# Patient Record
Sex: Female | Born: 1995 | Race: White | Hispanic: No | Marital: Married | Smoking: Never smoker
Health system: Southern US, Community
[De-identification: ages and names within clinical notes are randomized; demographics above are authoritative.]

## PROBLEM LIST (undated history)

## (undated) DIAGNOSIS — J45909 Unspecified asthma, uncomplicated: Secondary | ICD-10-CM

## (undated) DIAGNOSIS — G43909 Migraine, unspecified, not intractable, without status migrainosus: Secondary | ICD-10-CM

## (undated) DIAGNOSIS — F419 Anxiety disorder, unspecified: Secondary | ICD-10-CM

## (undated) HISTORY — PX: WISDOM TOOTH EXTRACTION: SHX21

---
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
## 2013-01-26 ENCOUNTER — Other Ambulatory Visit: Payer: Self-pay

## 2013-01-26 DIAGNOSIS — G43009 Migraine without aura, not intractable, without status migrainosus: Secondary | ICD-10-CM

## 2013-01-26 DIAGNOSIS — G4452 New daily persistent headache (NDPH): Secondary | ICD-10-CM

## 2013-01-26 MED ORDER — NADOLOL 20 MG PO TABS: 20.0000 mg | ORAL_TABLET | Freq: Every day | ORAL | Status: DC

## 2013-01-26 NOTE — Telephone Encounter (Signed)
Faxed to [phone]

## 2013-05-17 ENCOUNTER — Ambulatory Visit (INDEPENDENT_AMBULATORY_CARE_PROVIDER_SITE_OTHER): Payer: BC Managed Care – PPO | Admitting: Family

## 2013-05-17 ENCOUNTER — Encounter: Payer: Self-pay | Admitting: Family

## 2013-05-17 VITALS — BP 110/74 | HR 78 | Ht 60.25 in | Wt 186.8 lb

## 2013-05-17 DIAGNOSIS — G44219 Episodic tension-type headache, not intractable: Secondary | ICD-10-CM

## 2013-05-17 DIAGNOSIS — G43009 Migraine without aura, not intractable, without status migrainosus: Secondary | ICD-10-CM

## 2013-05-17 DIAGNOSIS — Z8669 Personal history of other diseases of the nervous system and sense organs: Secondary | ICD-10-CM

## 2013-05-17 NOTE — Progress Notes (Signed)
Patient: Bonnie Cole MRN: [id number] Sex: female DOB: [birth year]  Provider: Elveria Rising, NP Location of Care: [facility]  Note type: Routine return visit  History of Present Illness: Referral Source: Dr.Candice Smith History from: patient Chief Complaint: Headaches  Bonnie Cole is a 18 y.o. with history of headaches. She used to take Topamax for migraine prevention but it did not reduce her headache frequency or severity. She is now taking Nadolol, which resulted in improvement in her headaches. She reports today that her headaches and migraines are infrequent as long as she gets sufficient sleep. When she has a migraine, Sumatriptan works well to give her relief. Bonnie Cole had an allergic reaction to a food, possibly peanuts a couple of weeks ago and has been taking a steroid for that. She has some increase in headaches since taking this medication. She has an upcoming appointment with her PCP to further evaluate this allergy.   Review of Systems: 12 system review was remarkable for fatigue, chest pain, rash, short of breath, flushing, allergies, headache, dizziness, anxiety, insomnia and sleepiness  Past Medical History  Diagnosis Date  . Asthma   . Migraine   . Anxiety    Hospitalizations: no, Head Injury: no, Nervous System Infections: no, Immunizations up to date: yes Past Medical History Comments: [name] has history of syncope and dysautonomia. She was taking Imipramine but says that she has tapered off it successfully.  Surgical History Past Surgical History  Procedure Laterality Date  . Wisdom tooth extraction      Family History family history includes Dementia in her paternal grandmother; Heart attack in her maternal grandfather. Family History is otherwise negative for migraines, seizures, cognitive impairment, blindness, deafness, birth defects, chromosomal disorder, autism.  Social History History   Social History  . Marital Status:  Married    Spouse Name: N/A    Number of Children: N/A  . Years of Education: N/A   Social History Main Topics  . Smoking status: Never Smoker   . Smokeless tobacco: Never Used  . Alcohol Use: No  . Drug Use: No  . Sexual Activity: Yes   Other Topics Concern  . None   Social History Narrative  . None   Educational level: university  School Attending:[employer] Living with:  roommates  Hobbies/Interest: Homework School comments:  [name] is doing well in school. She is in her Sophomore year at [employer]. Her major is Psychology. She graduated from [employer] in 2013.  Physical Exam BP 110/74  Pulse 78  Ht 5' 0.25" (1.53 m)  Wt 186 lb 12.8 oz (84.732 kg)  BMI 36.20 kg/m2  LMP 04/11/2013 General: alert, well developed, well nourished young woman, in no acute distress, right-handed, blond hair, brown eyes Head: normocephalic, no dysmorphic features Ears, Nose and Throat: Otoscopic: tympanic membranes normal .  Pharynx: oropharynx is pink without exudates or tonsillar hypertrophy. Neck: supple, full range of motion, no cranial or cervical bruits Respiratory: auscultation clear Cardiovascular: no murmurs, pulses are normal Musculoskeletal: no skeletal deformities or apparent scoliosis Skin: no rashes or neurocutaneous lesions  Neurologic Exam  Mental Status: alert; oriented to person, place, and year; knowledge is normal for age; language is normal Cranial Nerves: visual fields are full to double simultaneous stimuli; extraocular movements are full and conjugate; pupils are round reactive to light; funduscopic examination shows sharp disc margins with normal vessels; symmetric facial strength; midline tongue and uvula; hearing normal and symmetric Motor: Normal strength,  tone, and mass; good fine motor movements; no pronator drift. Sensory: intact responses to touch and temperature Coordination: good finger-to-nose, rapid repetitive  alternating movements and finger apposition   Gait and Station: normal gait and station; patient is able to walk on heels, toes and tandem without difficulty; balance is adequate; Romberg exam is negative; Gower response is negative Reflexes: symmetric and diminished bilaterally; no clonus; bilateral flexor plantar responses.  Assessment and Plan Bonnie Cole is a 18 year old young woman with history of headaches and migraines. She is taking and tolerating Nadolol for headache prevention. She will continue her medication without change and will return in 1 year or sooner if needed.

## 2013-05-19 ENCOUNTER — Encounter: Payer: Self-pay | Admitting: Family

## 2013-05-19 DIAGNOSIS — G44219 Episodic tension-type headache, not intractable: Secondary | ICD-10-CM | POA: Insufficient documentation

## 2013-05-19 DIAGNOSIS — G43009 Migraine without aura, not intractable, without status migrainosus: Secondary | ICD-10-CM | POA: Insufficient documentation

## 2013-05-19 NOTE — Patient Instructions (Signed)
Continue your medication without change. Let me know if your headaches increase in frequency or severity.  Please return for follow up in 1 year or sooner if needed.

## 2013-09-12 IMAGING — CR DG CHEST 2V
2 series · 2 of 2 positions shown · non-contrast
Comparison: August 08, 2004

CLINICAL DATA: Chest pain

CHEST - 2 VIEW

[w chest pa]
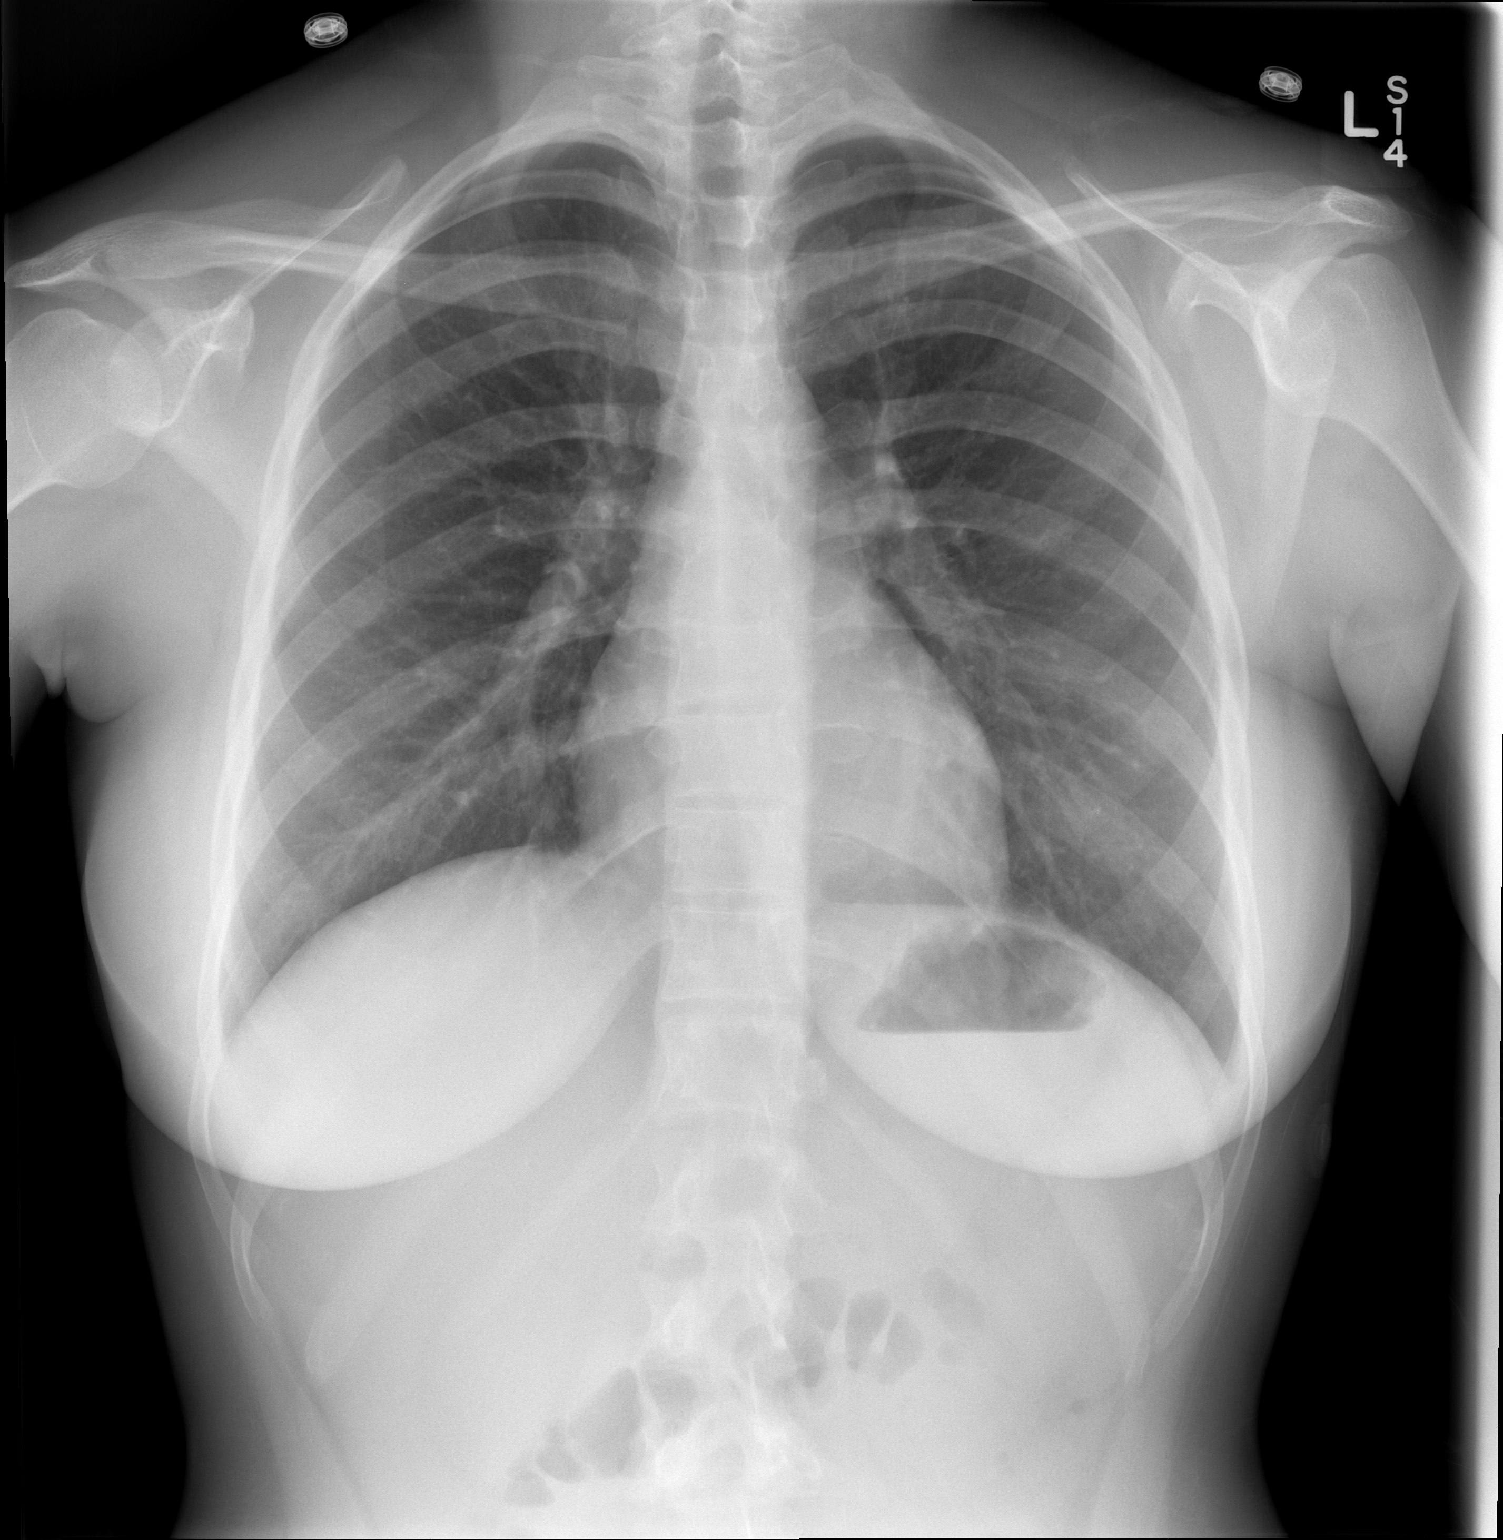

[w chest lat]
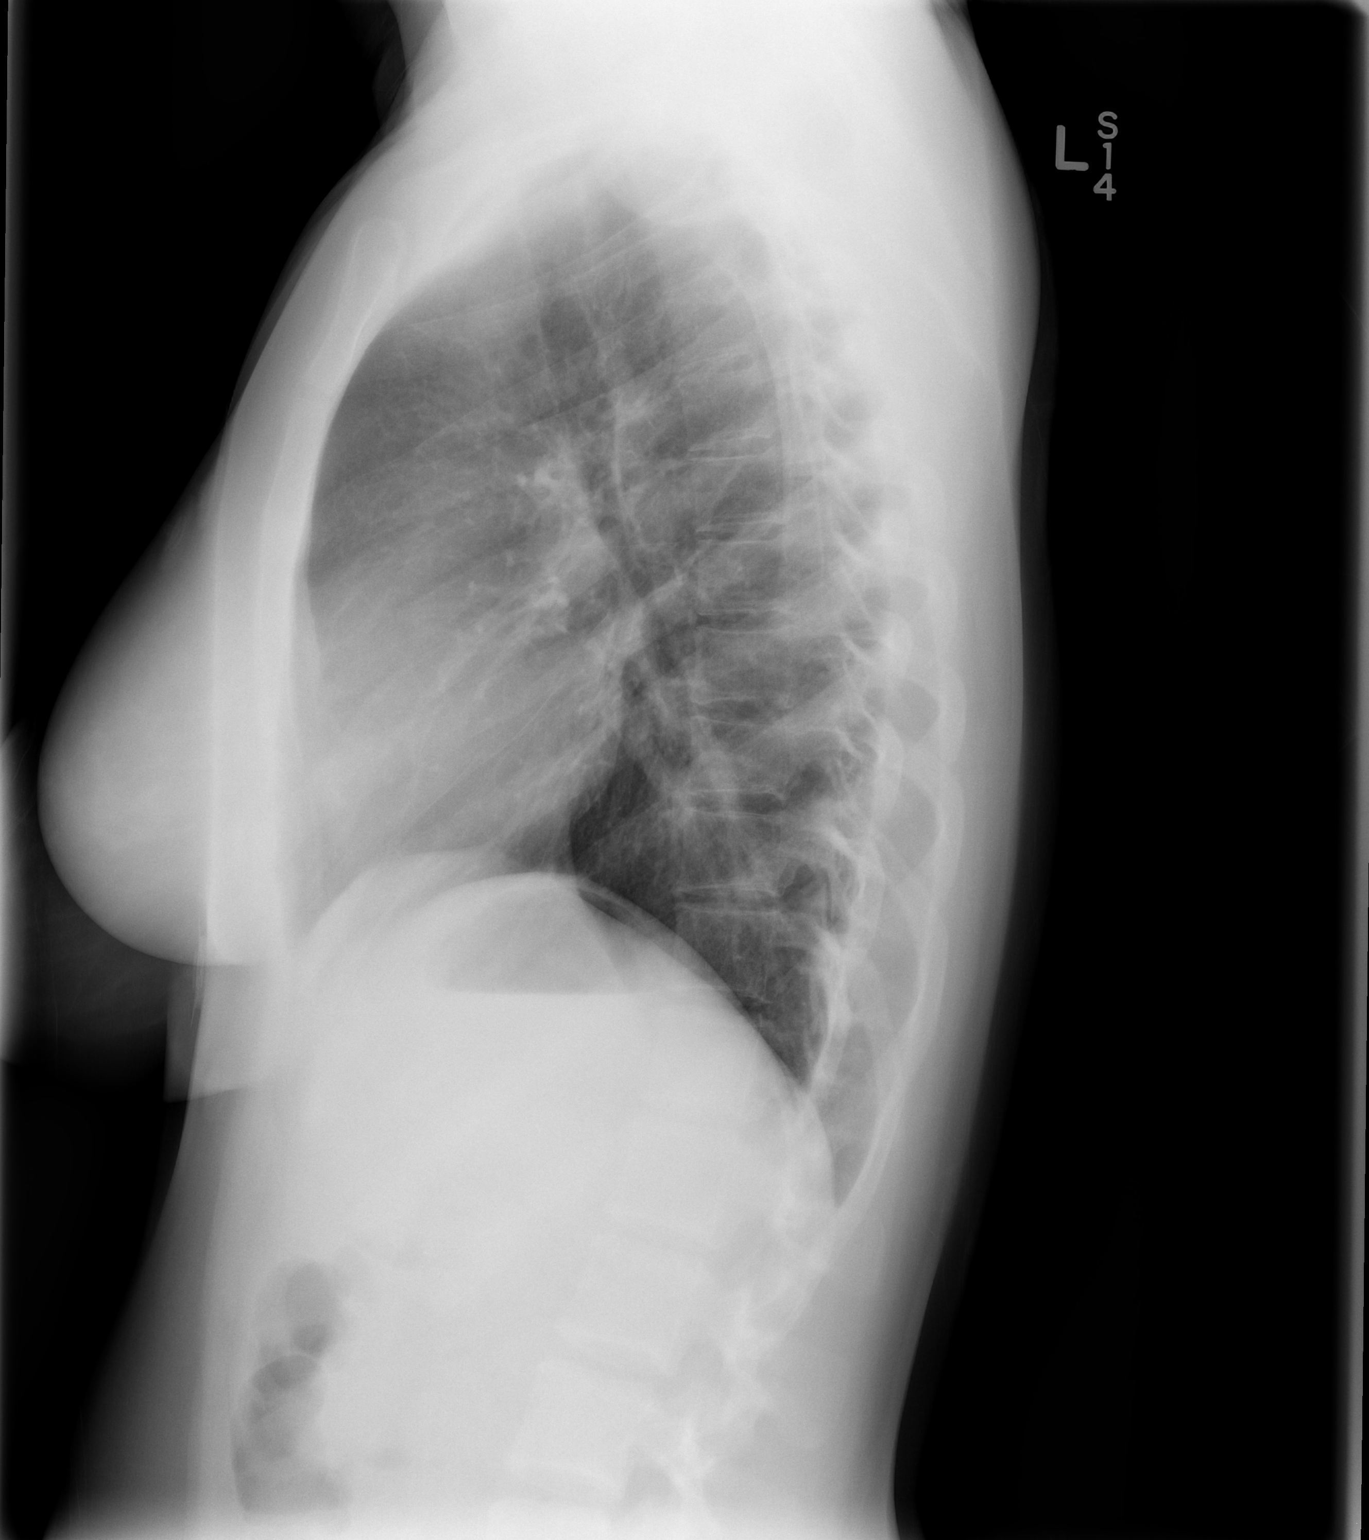

[2 of 2 positions shown; findings below may reference images not displayed]

FINDINGS: Lungs clear.  Heart size and pulmonary vascularity are
normal.  No adenopathy.  No bone lesions.  No pneumothorax.
IMPRESSION: No abnormality noted.

## 2014-02-08 ENCOUNTER — Other Ambulatory Visit: Payer: Self-pay

## 2014-02-08 MED ORDER — NADOLOL 20 MG PO TABS: 20.0000 mg | ORAL_TABLET | Freq: Every day | ORAL | Status: DC

## 2014-02-09 ENCOUNTER — Other Ambulatory Visit: Payer: Self-pay | Admitting: Family

## 2014-02-09 DIAGNOSIS — G43009 Migraine without aura, not intractable, without status migrainosus: Secondary | ICD-10-CM

## 2014-02-09 MED ORDER — TIZANIDINE HCL 2 MG PO TABS: ORAL_TABLET | ORAL | Status: DC

## 2014-05-10 ENCOUNTER — Encounter: Payer: Self-pay | Admitting: Family

## 2014-10-21 ENCOUNTER — Telehealth: Payer: Self-pay | Admitting: *Deleted

## 2014-10-21 NOTE — Telephone Encounter (Signed)
Spoke with Bonnie Cole and she agreed to come in for another appointment. We scheduled her for Tuesday August 16th, at 3:00pm.

## 2014-10-21 NOTE — Telephone Encounter (Signed)
Please let Bonnie Cole know that since it has been over a year since her last visit here, that she needs to return here for one more visit, then I will be happy to refer to her adult neurology. Her other option is for her PCP to refer her to adult neurology, if she has seen her PCP more recently. If she wants to schedule her, please schedule her with me. Thanks, Inetta Fermo

## 2014-10-21 NOTE — Telephone Encounter (Signed)
Bonnie Cole called and left a voicemail requesting a referral to adult neurology. She states she is no longer and in school and is aware that we cannot see her in this practice after she has finished school.

## 2014-10-25 ENCOUNTER — Ambulatory Visit: Payer: Self-pay | Admitting: Family

## 2014-11-17 ENCOUNTER — Ambulatory Visit (INDEPENDENT_AMBULATORY_CARE_PROVIDER_SITE_OTHER): Payer: 59 | Admitting: Pediatrics

## 2014-11-17 ENCOUNTER — Encounter: Payer: Self-pay | Admitting: Pediatrics

## 2014-11-17 ENCOUNTER — Ambulatory Visit: Payer: Self-pay | Admitting: Family

## 2014-11-17 VITALS — BP 118/78 | HR 76 | Ht 65.0 in | Wt 207.0 lb

## 2014-11-17 DIAGNOSIS — G43109 Migraine with aura, not intractable, without status migrainosus: Secondary | ICD-10-CM

## 2014-11-17 DIAGNOSIS — G44219 Episodic tension-type headache, not intractable: Secondary | ICD-10-CM | POA: Diagnosis not present

## 2014-11-17 DIAGNOSIS — G43909 Migraine, unspecified, not intractable, without status migrainosus: Secondary | ICD-10-CM

## 2014-11-17 DIAGNOSIS — E669 Obesity, unspecified: Secondary | ICD-10-CM

## 2014-11-17 MED ORDER — NADOLOL 40 MG PO TABS: ORAL_TABLET | ORAL | Status: DC

## 2014-11-17 MED ORDER — SUMATRIPTAN SUCCINATE 50 MG PO TABS: ORAL_TABLET | ORAL | Status: DC

## 2014-11-17 NOTE — Progress Notes (Signed)
Patient: Bonnie Cole MRN: [id number] Sex: female DOB: 1995-03-24  Provider: Deetta Perla, MD Location of Care: [facility]  Note type: Routine return visit  History of Present Illness: Referral Source: Severiano Gilbert, MD History from: patient and [facility] chart Chief Complaint: Headaches/Referral to adult neurologist  Bonnie Cole is a 19 y.o. female who was evaluated November 17, 2014 for the first time since May 17, 2013.  She has a history of migraine without aura and episodic tension-type headaches.  Topiramate did not reduce her headache frequency and also made her tired and irritable.  She was switched to nadolol, which resulted in improvement in her headaches.  Her rescue drugs included Excedrin Migraine, low-dose sumatriptan, and tizanidine.  The latter always worked.  The other two sometimes work depending on how soon after her headache she was able to take medication.  She is finishing up a psychiatric major at [employer] and will take a post-bac year to work on other courses.  She thinks that she wants to become a Surveyor, mining or a physician.  She needs to take prerequisites for these programs.  She tells me that her headaches have fluctuated and tend to be worse when she is at school although over the summer, her headaches worsened.  She estimates she has two to three incapacitating headaches per week.  In addition, she has a "mind fog" when she has her headaches.  She also experiences blurred vision.  This starts as a speckled aura in the left inferior visual field involving both eyes and to become more diffuse during the headache and concurrently with the headache.  She has occasional nausea, but no vomiting.  She complains of sensitivity to light, sound, and movement.  Headaches can last one to two hours if she takes medicine and it works for eight hours if it does not.  Sumatriptan at 25 mg has not always worked.  Tizanidine at 4 mg almost always does,  but she is unable to do anything else except sleep.  Her headaches affect her homework more than school.  She will not miss school because of a migraine.    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  She has struggled to find work and life balance.  She has gained 21 pounds since she was last seen.  She tried a vegetarian diet for a while, but did not help maintain her weight nor did it eliminate her headaches.  She only sleeps about six hours at nighttime, but admits that she needs seven or eight hours to function well.  She thinks that she may have been on amitriptyline in addition to topiramate in the past.  Review of Systems: 12 system review was unremarkable  Past Medical History Diagnosis Date  . Asthma   . Migraine   . Anxiety    Hospitalizations: No., Head Injury: No., Nervous System Infections: No., Immunizations up to date: Yes.    Behavior History none  Surgical History Procedure Laterality Date  . Wisdom tooth extraction     Family History family history includes Dementia in her paternal grandmother; Heart attack in her maternal grandfather. Family history is negative for migraines, seizures, intellectual disabilities, blindness, deafness, birth defects, chromosomal disorder, or autism.  Social History . Marital Status: Married    Spouse Name: N/A  . Number of Children: N/A  . Years of Education: N/A   Social History Main Topics  . Smoking status: Never Smoker   .  Smokeless tobacco: Never Used  . Alcohol Use: No  . Drug Use: No  . 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    [name] lives with her boyfriend.    [name] enjoys school, work, going out with friends, and walking her dog.    [name] is doing well in school.    Allergies Allergen Reactions  . Tetracyclines & Related Hives   Physical Exam BP 118/78 mmHg  Pulse 76  Ht 5\' 5"  (1.651  m)  Wt 207 lb (93.895 kg)  BMI 34.45 kg/m2  LMP 10/17/2014  General: alert, well developed, well nourished, in no acute distress, blond hair, brown eyes, right handed Head: normocephalic, no dysmorphic features Ears, Nose and Throat: Otoscopic: tympanic membranes normal; pharynx: oropharynx is pink without exudates or tonsillar hypertrophy Neck: supple, full range of motion, no cranial or cervical bruits Respiratory: auscultation clear Cardiovascular: no murmurs, pulses are normal Musculoskeletal: no skeletal deformities or apparent scoliosis Skin: no rashes or neurocutaneous lesions  Neurologic Exam  Mental Status: alert; oriented to person, place and year; knowledge is normal for age; language is normal Cranial Nerves: visual fields are full to double simultaneous stimuli; extraocular movements are full and conjugate; pupils are round reactive to light; funduscopic examination shows sharp disc margins with normal vessels; symmetric facial strength; midline tongue and uvula; air conduction is greater than bone conduction bilaterally Motor: Normal strength, tone and mass; good fine motor movements; no pronator drift Sensory: intact responses to cold, vibration, proprioception and stereognosis Coordination: good finger-to-nose, rapid repetitive alternating movements and finger apposition Gait and Station: normal gait and station: patient is able to walk on heels, toes and tandem without difficulty; balance is adequate; Romberg exam is negative; Gower response is negative Reflexes: symmetric and diminished bilaterally; no clonus; bilateral flexor plantar responses  Assessment 1. Migraine with aura and without status migrainosus, not intractable, G43.109. 2. Complicated migraine, G43.909. 3. Episodic tension-type headache not intractable, G44.219. 4. Obesity, E66.9.  Discussion [name] has a primary headache disorder.  This is based on normal examination, characteristic symptoms, and the  duration of those symptoms.  There is no family history of migraines.  In my opinion, she does not need neuroimaging.  Plan She will keep a daily prospective headache calendar.  She will work to try to increase the number of hours that she sleeps at night.  I recommended that she drink 2-1/2, 16-ounce containers of water per day and eat small frequent meals.  As I receive headache calendars, I will contact her.  She will return to see me in three months' time.  I spent 30 minutes of face-to-face time with Reis more than half of it in consultation.  I told her that we will continue to see her as long as she remains a student if that is her wish.   Medication List   This list is accurate as of: 11/17/14  6:42 PM.       albuterol 108 (90 BASE) MCG/ACT inhaler  Commonly known as:  PROVENTIL HFA;VENTOLIN HFA  Inhale 2 puffs into the lungs every 6 (six) hours as needed for wheezing.     nadolol 40 MG tablet  Commonly known as:  CORGARD  Take 1 tablet at bedtime     OCELLA 3-0.03 MG tablet  Generic drug:  drospirenone-ethinyl estradiol  Take 1 tablet by mouth at bedtime.     sertraline 50 MG  tablet  Commonly known as:  ZOLOFT  Take 1 tablet by mouth daily.     SUMAtriptan 50 MG tablet  Commonly known as:  IMITREX  Take 1 tablet with 400 mg of ibuprofen at the onset of headache, may repeat in 2 hours if headache persists or recurs.     tiZANidine 4 MG tablet  Commonly known as:  ZANAFLEX  Take 4 mg by mouth at bedtime as needed (for migraine).      The medication list was reviewed and reconciled. All changes or newly prescribed medications were explained.  A complete medication list was provided to the patient/caregiver.  Deetta Perla MD

## 2014-11-17 NOTE — Patient Instructions (Signed)
You need to plan your day so that you can get more than 6 hours of sleep.  The idea would be close to 8.  Need to drink 2-1/2-3 16 ounce water bottles per day throughout the day.  You should not skip meals, because that put stress on your body due to the need to fast.  Keep you headache calendar and send it to me at the end of each month.

## 2015-01-02 ENCOUNTER — Telehealth: Payer: Self-pay | Admitting: Pediatrics

## 2015-01-02 DIAGNOSIS — G43109 Migraine with aura, not intractable, without status migrainosus: Secondary | ICD-10-CM

## 2015-01-02 NOTE — Telephone Encounter (Addendum)
Headache calendar from September 2016 on Bonnie Connersmanda J Confer. 23 days were recorded.  2 days were headache free.  13 days were associated with tension type headaches, 10 required treatment.  There were 8 days of migraines, 2 were severe.  I called the patient and left a message for her to call back.

## 2015-01-10 MED ORDER — NADOLOL 40 MG PO TABS: ORAL_TABLET | ORAL | Status: DC

## 2015-01-10 NOTE — Telephone Encounter (Signed)
Bonnie Cole called returning Dr. Darl HouseholderHickling's call in regards to her headache calendar. She can be reached at [phone].

## 2015-01-10 NOTE — Telephone Encounter (Signed)
15 minutes phone call the patient.  Her headaches occur when she gets only 5 or 6 hours of sleep, and also during her menstrual period she had a total of 10 migraines last month.  Many of them can be attributed to one or the other issue.  On the 2 weeks when she was not with period and got adequate sleep, she had only one migraine each week.  Corgard is scored; we will increase her from 40-60 mg a day and see how she tolerates the medicine.  I will send a 90 day supply.

## 2015-05-25 ENCOUNTER — Encounter: Payer: Self-pay | Admitting: Family

## 2015-06-01 ENCOUNTER — Other Ambulatory Visit (HOSPITAL_COMMUNITY)
Admission: RE | Admit: 2015-06-01 | Discharge: 2015-06-01 | Disposition: A | Discharge status: Home / Self Care | Payer: 59 | Source: Ambulatory Visit | Attending: Family Medicine | Admitting: Family Medicine

## 2015-06-01 DIAGNOSIS — Z01419 Encounter for gynecological examination (general) (routine) without abnormal findings: Secondary | ICD-10-CM | POA: Insufficient documentation

## 2015-06-01 DIAGNOSIS — Z113 Encounter for screening for infections with a predominantly sexual mode of transmission: Secondary | ICD-10-CM | POA: Insufficient documentation

## 2016-12-02 ENCOUNTER — Encounter (HOSPITAL_BASED_OUTPATIENT_CLINIC_OR_DEPARTMENT_OTHER): Payer: Self-pay | Admitting: *Deleted

## 2016-12-03 ENCOUNTER — Encounter (HOSPITAL_BASED_OUTPATIENT_CLINIC_OR_DEPARTMENT_OTHER): Payer: Self-pay | Admitting: *Deleted

## 2016-12-03 NOTE — Progress Notes (Signed)
Bring all medications. Pack an overnight bag. 

## 2016-12-11 ENCOUNTER — Ambulatory Visit (HOSPITAL_BASED_OUTPATIENT_CLINIC_OR_DEPARTMENT_OTHER): Payer: 59 | Admitting: Anesthesiology

## 2016-12-11 ENCOUNTER — Ambulatory Visit (HOSPITAL_BASED_OUTPATIENT_CLINIC_OR_DEPARTMENT_OTHER)
Admission: RE | Admit: 2016-12-11 | Discharge: 2016-12-11 | Disposition: A | Discharge status: Home / Self Care | Payer: 59 | Source: Ambulatory Visit | Attending: Otolaryngology | Admitting: Otolaryngology

## 2016-12-11 ENCOUNTER — Encounter (HOSPITAL_BASED_OUTPATIENT_CLINIC_OR_DEPARTMENT_OTHER)
Admission: RE | Disposition: A | Discharge status: Home / Self Care | Payer: Self-pay | Source: Ambulatory Visit | Attending: Otolaryngology

## 2016-12-11 ENCOUNTER — Encounter (HOSPITAL_BASED_OUTPATIENT_CLINIC_OR_DEPARTMENT_OTHER): Payer: Self-pay | Admitting: Anesthesiology

## 2016-12-11 DIAGNOSIS — Z881 Allergy status to other antibiotic agents status: Secondary | ICD-10-CM | POA: Diagnosis not present

## 2016-12-11 DIAGNOSIS — J358 Other chronic diseases of tonsils and adenoids: Secondary | ICD-10-CM | POA: Insufficient documentation

## 2016-12-11 DIAGNOSIS — R196 Halitosis: Secondary | ICD-10-CM | POA: Insufficient documentation

## 2016-12-11 HISTORY — PX: TONSILLECTOMY: SHX5217

## 2016-12-11 SURGERY — TONSILLECTOMY
Anesthesia: General | Site: Throat | Laterality: Bilateral

## 2016-12-11 MED ORDER — SCOPOLAMINE 1 MG/3DAYS TD PT72
1.0000 | MEDICATED_PATCH | Freq: Once | TRANSDERMAL | Status: DC | PRN
  Administered 2016-12-11: 1.5 mg via TRANSDERMAL

## 2016-12-11 MED ORDER — HYDROMORPHONE HCL 1 MG/ML IJ SOLN
0.2500 mg | INTRAMUSCULAR | Status: DC | PRN
  Administered 2016-12-11 (×2): 0.5 mg via INTRAVENOUS
  Administered 2016-12-11: 0.25 mg via INTRAVENOUS

## 2016-12-11 MED ORDER — PROPOFOL 500 MG/50ML IV EMUL
INTRAVENOUS | Status: AC
  Filled 2016-12-11: qty 50

## 2016-12-11 MED ORDER — OXYCODONE HCL 5 MG PO TABS: 5.0000 mg | ORAL_TABLET | ORAL | 0 refills | Status: DC | PRN

## 2016-12-11 MED ORDER — HYDROMORPHONE HCL 1 MG/ML IJ SOLN
INTRAMUSCULAR | Status: AC
  Filled 2016-12-11: qty 0.5

## 2016-12-11 MED ORDER — MIDAZOLAM HCL 2 MG/2ML IJ SOLN
1.0000 mg | INTRAMUSCULAR | Status: DC | PRN
  Administered 2016-12-11: 2 mg via INTRAVENOUS

## 2016-12-11 MED ORDER — SCOPOLAMINE 1 MG/3DAYS TD PT72
MEDICATED_PATCH | TRANSDERMAL | Status: AC
  Filled 2016-12-11: qty 1

## 2016-12-11 MED ORDER — PROMETHAZINE HCL 25 MG/ML IJ SOLN
INTRAMUSCULAR | Status: AC
  Filled 2016-12-11: qty 1

## 2016-12-11 MED ORDER — MIDAZOLAM HCL 2 MG/2ML IJ SOLN
INTRAMUSCULAR | Status: AC
  Filled 2016-12-11: qty 2

## 2016-12-11 MED ORDER — PROPOFOL 10 MG/ML IV BOLUS
INTRAVENOUS | Status: DC | PRN
Start: 2016-12-11 — End: 2016-12-11
  Administered 2016-12-11: 180 mg via INTRAVENOUS

## 2016-12-11 MED ORDER — OXYCODONE HCL 5 MG/5ML PO SOLN
5.0000 mg | Freq: Once | ORAL | Status: AC | PRN
  Administered 2016-12-11: 5 mg via ORAL

## 2016-12-11 MED ORDER — PROMETHAZINE HCL 25 MG/ML IJ SOLN
6.2500 mg | Freq: Four times a day (QID) | INTRAMUSCULAR | Status: AC | PRN
  Administered 2016-12-11: 6.25 mg via INTRAVENOUS

## 2016-12-11 MED ORDER — ONDANSETRON HCL 4 MG/2ML IJ SOLN
INTRAMUSCULAR | Status: AC
  Filled 2016-12-11: qty 2

## 2016-12-11 MED ORDER — DEXAMETHASONE SODIUM PHOSPHATE 10 MG/ML IJ SOLN
INTRAMUSCULAR | Status: AC
  Filled 2016-12-11: qty 1

## 2016-12-11 MED ORDER — SUCCINYLCHOLINE CHLORIDE 20 MG/ML IJ SOLN
INTRAMUSCULAR | Status: DC | PRN
  Administered 2016-12-11: 100 mg via INTRAVENOUS

## 2016-12-11 MED ORDER — 0.9 % SODIUM CHLORIDE (POUR BTL) OPTIME
TOPICAL | Status: DC | PRN
  Administered 2016-12-11: 120 mL

## 2016-12-11 MED ORDER — FENTANYL CITRATE (PF) 100 MCG/2ML IJ SOLN
INTRAMUSCULAR | Status: AC
  Filled 2016-12-11: qty 4

## 2016-12-11 MED ORDER — OXYCODONE HCL 5 MG/5ML PO SOLN
ORAL | Status: AC
  Filled 2016-12-11: qty 5

## 2016-12-11 MED ORDER — PROPOFOL 500 MG/50ML IV EMUL
INTRAVENOUS | Status: DC | PRN
  Administered 2016-12-11: 50 ug/kg/min via INTRAVENOUS

## 2016-12-11 MED ORDER — LIDOCAINE HCL (CARDIAC) 20 MG/ML IV SOLN
INTRAVENOUS | Status: DC | PRN
  Administered 2016-12-11: 60 mg via INTRAVENOUS

## 2016-12-11 MED ORDER — FENTANYL CITRATE (PF) 100 MCG/2ML IJ SOLN
50.0000 ug | INTRAMUSCULAR | Status: DC | PRN
  Administered 2016-12-11 (×2): 100 ug via INTRAVENOUS

## 2016-12-11 MED ORDER — MEPERIDINE HCL 25 MG/ML IJ SOLN: 6.2500 mg | INTRAMUSCULAR | Status: DC | PRN

## 2016-12-11 MED ORDER — CIPROFLOXACIN IN D5W 400 MG/200ML IV SOLN
INTRAVENOUS | Status: AC
  Filled 2016-12-11: qty 200

## 2016-12-11 MED ORDER — LACTATED RINGERS IV SOLN
INTRAVENOUS | Status: DC
  Administered 2016-12-11 (×2): via INTRAVENOUS

## 2016-12-11 MED ORDER — DEXAMETHASONE SODIUM PHOSPHATE 4 MG/ML IJ SOLN
INTRAMUSCULAR | Status: DC | PRN
  Administered 2016-12-11: 10 mg via INTRAVENOUS

## 2016-12-11 MED ORDER — METOCLOPRAMIDE HCL 5 MG/ML IJ SOLN: 10.0000 mg | Freq: Once | INTRAMUSCULAR | Status: DC | PRN

## 2016-12-11 MED ORDER — OXYCODONE HCL 5 MG PO TABS: 5.0000 mg | ORAL_TABLET | Freq: Once | ORAL | Status: AC | PRN

## 2016-12-11 MED ORDER — OXYMETAZOLINE HCL 0.05 % NA SOLN
NASAL | Status: AC
  Filled 2016-12-11: qty 15

## 2016-12-11 SURGICAL SUPPLY — 30 items
CANISTER SUCT 1200ML W/VALVE (MISCELLANEOUS) ×3 IMPLANT
CATH ROBINSON RED A/P 10FR (CATHETERS) IMPLANT
CLEANER CAUTERY TIP 5X5 PAD (MISCELLANEOUS) IMPLANT
COAGULATOR SUCT SWTCH 10FR 6 (ELECTROSURGICAL) ×3 IMPLANT
COVER BACK TABLE 60X90IN (DRAPES) ×3 IMPLANT
COVER MAYO STAND STRL (DRAPES) ×3 IMPLANT
ELECT COATED BLADE 2.86 ST (ELECTRODE) ×3 IMPLANT
ELECT REM PT RETURN 9FT ADLT (ELECTROSURGICAL) ×3
ELECT REM PT RETURN 9FT PED (ELECTROSURGICAL)
ELECTRODE REM PT RETRN 9FT PED (ELECTROSURGICAL) IMPLANT
ELECTRODE REM PT RTRN 9FT ADLT (ELECTROSURGICAL) ×1 IMPLANT
GAUZE SPONGE 4X4 12PLY STRL LF (GAUZE/BANDAGES/DRESSINGS) ×3 IMPLANT
GLOVE BIO SURGEON STRL SZ 6.5 (GLOVE) ×3 IMPLANT
GLOVE SURG SS PI 7.0 STRL IVOR (GLOVE) ×2 IMPLANT
GOWN STRL REUS W/ TWL LRG LVL3 (GOWN DISPOSABLE) ×4 IMPLANT
GOWN STRL REUS W/TWL LRG LVL3 (GOWN DISPOSABLE) ×6
NS IRRIG 1000ML POUR BTL (IV SOLUTION) ×3 IMPLANT
PACK BASIN DAY SURGERY FS (CUSTOM PROCEDURE TRAY) ×3 IMPLANT
PAD ARMBOARD 7.5X6 YLW CONV (MISCELLANEOUS) ×3 IMPLANT
PAD CLEANER CAUTERY TIP 5X5 (MISCELLANEOUS)
PENCIL FOOT CONTROL (ELECTRODE) ×3 IMPLANT
SHEET MEDIUM DRAPE 40X70 STRL (DRAPES) ×2 IMPLANT
SOLUTION BUTLER CLEAR DIP (MISCELLANEOUS) ×3 IMPLANT
SPONGE TONSIL 1.25 RF SGL STRG (GAUZE/BANDAGES/DRESSINGS) ×3 IMPLANT
SYR BULB 3OZ (MISCELLANEOUS) ×2 IMPLANT
TOWEL OR 17X24 6PK STRL BLUE (TOWEL DISPOSABLE) ×3 IMPLANT
TUBE SALEM SUMP 12R W/ARV (TUBING) IMPLANT
TUBE SALEM SUMP 16 FR W/ARV (TUBING) ×3 IMPLANT
TUBING SUCTION 1/4X6FT (MISCELLANEOUS) ×3 IMPLANT
YANKAUER SUCT BULB TIP NO VENT (SUCTIONS) ×3 IMPLANT

## 2016-12-11 NOTE — Anesthesia Procedure Notes (Signed)
Procedure Name: Intubation Date/Time: 12/11/2016 8:41 AM Performed by: Maryella Shivers Pre-anesthesia Checklist: Patient identified, Emergency Drugs available, Suction available and Patient being monitored Patient Re-evaluated:Patient Re-evaluated prior to induction Oxygen Delivery Method: Circle system utilized Preoxygenation: Pre-oxygenation with 100% oxygen Induction Type: IV induction Ventilation: Mask ventilation without difficulty Laryngoscope Size: Mac and 3 Grade View: Grade I Tube type: Oral Tube size: 7.0 mm Number of attempts: 1 Airway Equipment and Method: Stylet and Oral airway Placement Confirmation: ETT inserted through vocal cords under direct vision,  positive ETCO2 and breath sounds checked- equal and bilateral Secured at: 22 cm Tube secured with: Tape Dental Injury: Teeth and Oropharynx as per pre-operative assessment

## 2016-12-11 NOTE — Discharge Instructions (Addendum)
Take  tylenol, then 3 hours later  ibuprofen, then 3hours later tylenol, etc around the clock for 3-5 days for pain control. Do not exceed 4grams of tylenol in a 24 hour period.    Tonsillectomy, Adult, Care After This sheet gives you information about how to care for yourself after your procedure. Your health care provider may also give you more specific instructions. If you have problems or questions, contact your health care provider. What can I expect after the procedure? After your procedure, it is common to have:  A numb tongue.  A reduced sense of taste.  Difficulty swallowing and pain when swallowing.  Pain or a clicking noise when yawning or chewing.  Liquids that you drink leaking out of your nose.  A muffled sound to your voice.  Swelling in the middle of the roof of the mouth (uvula).  A constant cough and a need to clear mucus and phlegm from your throat.  Specks of blood in your saliva or when you blow your nose.  Snoring or breathing through the mouth during sleep.  A thick, white scab that forms where the tonsils used to be. It will cause bad breath.  Follow these instructions at home: Eating and drinking  Follow instructions from your health care provider about eating or drinking restrictions.  For the first several days after surgery, choose foods that are soft and cold, such as gelatin, sherbet, ice cream, and frozen ice pops. These types of foods are usually the easiest to eat.  If you have nausea or vomiting, start with liquids that are cold and that you can see through (clear liquids), such as water and apple juice without pulp. When you can tolerate these fluids safely, you may advance to thicker liquids and soft foods, such as: ? Creamed soups. ? Soft, warm cereals, such as oatmeal or hot wheat cereal. ? Milk. ? Mashed potatoes. ? Applesauce.  Drink enough fluid to keep your urine clear or pale yellow. Driving  Do not drive for 24 hours  if you were given a medicine to help you relax (sedative).  Do not drive or use heavy machinery while taking prescription pain medicine or until your health care provider approves. General instructions  Rest.  Keep your head raised (elevated) at all times when lying down.  Take over-the-counter and prescription medicines only as told by your health care provider.  Do not use mouthwashes until your health care provider approves.  Gargle only as told by your health care provider.  Avoid contact with people who have infections, such as colds and sore throats. Contact a health care provider if:  Your pain gets worse or is not controlled with medicines.  You have a high fever.  You have a rash.  You feel light-headed or you faint.  You are unable to swallow even small amounts of liquid or saliva.  Get help right away if:  You have trouble breathing.  You bleed bright red blood from your throat.  You vomit bright red blood. Summary  Follow instructions from your health care provider about eating or drinking restrictions. For the first several days after surgery, soft and cold foods are usually the easiest to eat.  Talk with your health care provider about ways to manage your pain. Keeping pain under control can help you rest and make swallowing easier.  Small specks of blood in your saliva is normal after surgery. Bleeding more than this is a serious complication. Get help right away if  you bleed bright red blood from your throat or you vomit bright red blood. This information is not intended to replace advice given to you by your health care provider. Make sure you discuss any questions you have with your health care provider. Document Released: 12/27/2003 Document Revised: 01/19/2016 Document Reviewed: 01/19/2016 Elsevier Interactive Patient Education  2017 Else[employer]     Post Anesthesia Home Care Instructions  Activity: Get plenty of rest for the remainder of the  day. A responsible individual must stay with you for 24 hours following the procedure.  For the next 24 hours, DO NOT: -Drive a car -Advertising copywriter -Drink alcoholic beverages -Take any medication unless instructed by your physician -Make any legal decisions or sign important papers.  Meals: Start with liquid foods such as gelatin or soup. Progress to regular foods as tolerated. Avoid greasy, spicy, heavy foods. If nausea and/or vomiting occur, drink only clear liquids until the nausea and/or vomiting subsides. Call your physician if vomiting continues.  Special Instructions/Symptoms: Your throat may feel dry or sore from the anesthesia or the breathing tube placed in your throat during surgery. If this causes discomfort, gargle with warm salt water. The discomfort should disappear within 24 hours.  If you had a scopolamine patch placed behind your ear for the management of post- operative nausea and/or vomiting:  1. The medication in the patch is effective for 72 hours, after which it should be removed.  Wrap patch in a tissue and discard in the trash. Wash hands thoroughly with soap and water. 2. You may remove the patch earlier than 72 hours if you experience unpleasant side effects which may include dry mouth, dizziness or visual disturbances. 3. Avoid touching the patch. Wash your hands with soap and water after contact with the patch.

## 2016-12-11 NOTE — Op Note (Signed)
DATE OF PROCEDURE:  12/11/2016     PRE-OPERATIVE DIAGNOSIS:  TONSILLOLITHS Halitosis Tonsillar asymmetry     POST-OPERATIVE DIAGNOSIS:  Same     PROCEDURE(S):  Tonsillectomy     SURGEON:  Graylin Shiver, MD    ASSISTANT(S):  none    ANESTHESIA:  General endotracheal anesthesia    ESTIMATED BLOOD LOSS:  scant    SPECIMENS:  Right tonsil Left tonsil    COMPLICATIONS:  None    OPERATIVE FINDINGS:  Cryptic tonsils with tonsillar asymmetry, R>L.     OPERATIVE DETAILS:  With the patient in a comfortable supine position,  general orotracheal anesthesia was induced without difficulty.  A routine surgical timeout was performed.  The patient was turned 90 away from anesthesia. A clean preparation and draping was accomplished.  Taking care to protect lips, teeth, and endotracheal tube, the Crowe-Davis mouth gag was introduced, expanded for visualization, and suspended from the Mayo stand in the standard fashion. A red rubber catheter was passed through the nose and out the mouth to serve as a Producer, television/film/video. Beginning on the  right side, the tonsil was grasped and retracted medially.  The mucosa over the anterior and superior poles was coagulated and then cut down to the capsule of the tonsil using electrocautery until the tonsil was freed. This was then passed off the field. An identical procedure was performed on the left side. Findings as noted above.      At this point the palate retractor and mouthgag were relaxed for several minutes.  Upon reexpansion,  hemostasis was observed.  An orogastric tube was briefly placed and a small amount of clear secretions was evacuated.  This tube was removed.  The mouth gag and palate retractor were relaxed and removed.  The dental status was intact.   With the procedure complete, the patient was returned to the care of the anesthesia staff, awakened, extubated, and transferred to recovery in stable  condition.

## 2016-12-11 NOTE — Transfer of Care (Signed)
Immediate Anesthesia Transfer of Care Note  Patient: Bonnie Cole  Procedure(s) Performed: TONSILLECTOMY (Bilateral Throat)  Patient Location: PACU  Anesthesia Type:General  Level of Consciousness: sedated  Airway & Oxygen Therapy: Patient Spontanous Breathing and Patient connected to face mask oxygen  Post-op Assessment: Report given to RN and Post -op Vital signs reviewed and stable  Post vital signs: Reviewed and stable  Last Vitals:  Vitals:   12/11/16 0803  BP: 134/88  Pulse: 82  Resp: 20  Temp: 36.9 C  SpO2: 99%    Last Pain:  Vitals:   12/11/16 0803  TempSrc: Oral  PainSc: 4          Complications: No apparent anesthesia complications

## 2016-12-11 NOTE — Anesthesia Postprocedure Evaluation (Signed)
Anesthesia Post Note  Patient: Bonnie Cole  Procedure(s) Performed: TONSILLECTOMY (Bilateral Throat)     Patient location during evaluation: PACU Anesthesia Type: General Level of consciousness: awake and alert and oriented Pain management: pain level controlled Vital Signs Assessment: post-procedure vital signs reviewed and stable Respiratory status: spontaneous breathing, nonlabored ventilation and respiratory function stable Cardiovascular status: blood pressure returned to baseline and stable Postop Assessment: no apparent nausea or vomiting Anesthetic complications: no    Last Vitals:  Vitals:   12/11/16 1000 12/11/16 1015  BP: (!) 129/92 (!) 132/98  Pulse: 79 75  Resp: 16 20  Temp:    SpO2: 98% 99%    Last Pain:  Vitals:   12/11/16 1015  TempSrc:   PainSc: 7                  Chaquetta Schlottman A.

## 2016-12-11 NOTE — Anesthesia Preprocedure Evaluation (Signed)
Anesthesia Evaluation  Patient identified by MRN, date of birth, ID band Patient awake    Reviewed: Allergy & Precautions, H&P , Patient's Chart, lab work & pertinent test results, reviewed documented beta blocker date and time   Airway Mallampati: II  TM Distance: >3 FB Neck ROM: full    Dental no notable dental hx.    Pulmonary asthma ,    Pulmonary exam normal breath sounds clear to auscultation       Cardiovascular  Rhythm:regular Rate:Normal     Neuro/Psych    GI/Hepatic   Endo/Other    Renal/GU      Musculoskeletal   Abdominal   Peds  Hematology   Anesthesia Other Findings   Reproductive/Obstetrics                             Anesthesia Physical Anesthesia Plan  ASA: II  Anesthesia Plan: General   Post-op Pain Management:    Induction: Intravenous  PONV Risk Score and Plan: 2 and Ondansetron and Dexamethasone  Airway Management Planned: Oral ETT  Additional Equipment:   Intra-op Plan:   Post-operative Plan: Extubation in OR  Informed Consent: I have reviewed the patients History and Physical, chart, labs and discussed the procedure including the risks, benefits and alternatives for the proposed anesthesia with the patient or authorized representative who has indicated his/her understanding and acceptance.   Dental Advisory Given  Plan Discussed with: CRNA and Surgeon  Anesthesia Plan Comments: (  )        Anesthesia Quick Evaluation  

## 2016-12-11 NOTE — H&P (Signed)
The surgical history remains accurate and without interval change. The condition still exists which makes the procedure necessary. Surgical consent obtained.   Graylin Shiver, MD    Otolaryngology New Patient Note  Subjective: Ms. Bonnie Cole is a 21 y.o. female kindly referred by Fayne Mediate, * for evaluation of tonsil problems. She had a URI about a year ago, at which time her tonsils were very enlarged. She was on multiple consecutive rounds of antibiotics. Her tonsils have been bilaterally enlarged since that time, but the right is larger than the left. She reports tonsilloliths which occur every few days. She uses Q tips and irrigations to clean the stones every 2-3 days. Denies otalgia, dysphagia, neck masses, weight loss. Has not had a strep infection in the last year. Last time she was on antibiotics for her tonsils about one year ago. Last time she was on antibiotics for a throat infection was prior to a year ago, unsure when. +Halitosis, which is worse when there is a stone in her tonsil. She has tried a tongue scraper to use on the back of her throat two years ago. She brushes her teeth, flosses, use tongue scraper. If bad breath persists, she knows she has a tonsil stone and will remove it. This happens every few days. Usually, this is not painful for her, unless she is sick with a URI, about 2-3x per year. She had numerous tonsil infections as a child and a few during college.   Non-smoker, lives in [place], worksat [employer]. Student at [employer].   Past Medical History:  Diagnosis Date  . Headache   Past Surgical History:  Procedure Laterality Date  . WISDOM TOOTH EXTRACTION   Family History  Problem Relation Age of Onset  . Allergies Mother  . Migraines Mother  . Mental retardation Maternal Aunt  . Diabetes Paternal Aunt  . Migraines Maternal Grandmother  . Heart disease Maternal Grandfather  . Hypertension Maternal Grandfather  . Hypertension  Paternal Grandmother  . Diabetes Paternal Grandmother  . Alzheimer's disease Paternal Grandmother  . Cancer Paternal Grandfather  . Heart disease Paternal Grandfather  . Hypertension Paternal Grandfather  . Diabetes Paternal Grandfather   Social History   Social History  . Marital status: N/A  Spouse name: N/A  . Number of children: N/A  . Years of education: N/A   Occupational History  . Not on file.   Social History Main Topics  . Smoking status: Never Smoker  . Smokeless tobacco: Never Used  . Alcohol use Yes  . Drug use: Unknown  . Sexual activity: Not on file   Other Topics Concern  . Not on file   Social History Narrative  . No narrative on file   Allergies  Allergen Reactions  . Tetracycline Rash (ALLERGY/intolerance)  HIVES   Updated Medication List:   sertraline (ZOLOFT) 50 MG tablet  Sig - Route: Take 1 tablet by mouth. - Oral  Class: Historical Med  tiZANidine (ZANAFLEX) 4 MG tablet  Sig - Route: Take by mouth. - Oral  Class: Historical Med    ROS A 12-pt review of systems was conducted and was negative except as stated in the HPI.   Objective:  Vitals:   12/11/16 0803  BP: 134/88  Pulse: 82  Resp: 20  Temp: 98.5 F (36.9 C)  SpO2: 99%     Physical Exam:  General Normocephalic, Awake, Alert and appropriate for the exam  Eyes PERRL, no scleral icterus or conjunctival  hemorrhage.  EOMI.  Ears EAC patent, no obstructing cerumen Right TM: intact, no effusion, no retraction, normal landmarks Left TM: intact, no effusion, no retraction, normal landmarks  Nose Patent, No polyps or masses seen.  Oral Pharynx No mucosal lesions or tumors seen. Dentition is grossly normal.   +obvious halotosis Tonsillar asymmetry, cryptic-appearing tonsils. +tonsilloliths on the right 3+ right, 2+ left No palpable masses.  Lymphatics No cervical lymphadenopathy or masses on palpation  Endocrine No thyroidmegaly, no thyroid masses palpated   Cardio-vascular No cyanosis, regular rate  Pulmonary No audible stridor, Breathing easily with no labor. No dysphonia.  Neuro Symmetric facial movement.  Tongue protrudes in midline.  Psychiatry Appropriate affect and mood for clinic visit.  Skin No scars or lesions on face or neck.    Assessment:  My impression is that Rami has  1. Tonsil asymmetry  2. Halitosis  3. Tonsillolith   Persistent tonsillar asymmetry after multiple tonsil infections last year, in the setting of recurrent halitosis and tonsil stones which has failed conservative measures.    Plan:  1. We discussed the risks and benefits of tonsillectomy, including continued throat infections, persistent halitosis, bleeding, airway swelling, injury to surrounding structures, damage to lips/teeth/tongue. I took time to answer questions. She will discuss her options with her parents and call us back. Should she desire, tonsillectomy may be scheduled at her convenience.

## 2016-12-12 ENCOUNTER — Encounter (HOSPITAL_BASED_OUTPATIENT_CLINIC_OR_DEPARTMENT_OTHER): Payer: Self-pay | Admitting: Otolaryngology

## 2019-11-07 DIAGNOSIS — Z20822 Contact with and (suspected) exposure to covid-19: Secondary | ICD-10-CM | POA: Diagnosis not present

## 2019-11-07 DIAGNOSIS — H02522 Blepharophimosis right lower eyelid: Secondary | ICD-10-CM | POA: Diagnosis not present

## 2019-11-07 DIAGNOSIS — R519 Headache, unspecified: Secondary | ICD-10-CM | POA: Diagnosis not present

## 2020-02-24 DIAGNOSIS — Z03818 Encounter for observation for suspected exposure to other biological agents ruled out: Secondary | ICD-10-CM | POA: Diagnosis not present

## 2020-02-24 DIAGNOSIS — Z20822 Contact with and (suspected) exposure to covid-19: Secondary | ICD-10-CM | POA: Diagnosis not present

## 2020-07-24 DIAGNOSIS — Z202 Contact with and (suspected) exposure to infections with a predominantly sexual mode of transmission: Secondary | ICD-10-CM | POA: Diagnosis not present

## 2020-12-05 ENCOUNTER — Other Ambulatory Visit (HOSPITAL_COMMUNITY)
Admission: RE | Admit: 2020-12-05 | Discharge: 2020-12-05 | Disposition: A | Discharge status: Home / Self Care | Payer: BC Managed Care – PPO | Source: Ambulatory Visit | Attending: Obstetrics and Gynecology | Admitting: Obstetrics and Gynecology

## 2020-12-05 ENCOUNTER — Encounter: Payer: Self-pay | Admitting: Obstetrics and Gynecology

## 2020-12-05 ENCOUNTER — Ambulatory Visit (INDEPENDENT_AMBULATORY_CARE_PROVIDER_SITE_OTHER): Payer: BC Managed Care – PPO | Admitting: Obstetrics and Gynecology

## 2020-12-05 ENCOUNTER — Other Ambulatory Visit: Payer: Self-pay

## 2020-12-05 VITALS — BP 146/86 | HR 92 | Ht 64.0 in | Wt 228.8 lb

## 2020-12-05 DIAGNOSIS — Z118 Encounter for screening for other infectious and parasitic diseases: Secondary | ICD-10-CM | POA: Insufficient documentation

## 2020-12-05 DIAGNOSIS — Z01419 Encounter for gynecological examination (general) (routine) without abnormal findings: Secondary | ICD-10-CM | POA: Diagnosis not present

## 2020-12-05 NOTE — Progress Notes (Signed)
Subjective:     Bonnie Cole is a 25 y.o. female P0 with Mirena IUD induced amenorrhea and BMI 39 who is here for a comprehensive physical exam. The patient reports no problems. She is sexually active without complaints. She denies pelvic pain or abnormal discharge. IUD was inserted in 2017.  Past Medical History:  Diagnosis Date   Anxiety    Migraine    migraines   Past Surgical History:  Procedure Laterality Date   TONSILLECTOMY Bilateral 12/11/2016   Procedure: TONSILLECTOMY;  Surgeon: Graylin Shiver, MD;  Location: [facility];  Service: ENT;  Laterality: Bilateral;   WISDOM TOOTH EXTRACTION     Family History  Problem Relation Age of Onset   Diabetes Paternal Grandfather    Diabetes Paternal Grandmother    Dementia Paternal Grandmother        Died in her sleep   Heart attack Maternal Grandfather    Hypertension Mother     Social History   Socioeconomic History   Marital status: Married    Spouse name: Not on file   Number of children: Not on file   Years of education: Not on file   Highest education level: Not on file  Occupational History   Not on file  Tobacco Use   Smoking status: Never   Smokeless tobacco: Never  Vaping Use   Vaping Use: Never used  Substance and Sexual Activity   Alcohol use: Yes    Comment: Socially on weekends   Drug use: No   Sexual activity: Yes    Birth control/protection: I.U.D.  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   Bonnie Cole lives with her boyfriend.   Bonnie Cole enjoys school, work, going out with friends, and walking her dog.   Bonnie Cole is doing good in school.    Social Determinants of Health   Financial Resource Strain: Not on file  Food Insecurity: Not on file  Transportation Needs: Not on file  Physical Activity: Not on file  Stress: Not on file  Social Connections: Not on file  Intimate Partner Violence:  Not on file   Health Maintenance  Topic Date Due   COVID-19 Vaccine (1) Never done   HIV Screening  Never done   Hepatitis C Screening  Never done   TETANUS/TDAP  Never done   PAP-Cervical Cytology Screening  Never done   PAP SMEAR-Modifier  Never done   INFLUENZA VACCINE  Never done   HPV VACCINES  Aged Out       Review of Systems Pertinent items noted in HPI and remainder of comprehensive ROS otherwise negative.   Objective:  Blood pressure (!) 146/86, pulse 92, height 5\' 4"  (1.626 m), weight 228 lb 12.8 oz (103.8 kg).   GENERAL: Well-developed, well-nourished female in no acute distress.  HEENT: Normocephalic, atraumatic. Sclerae anicteric.  NECK: Supple. Normal thyroid.  LUNGS: Clear to auscultation bilaterally.  HEART: Regular rate and rhythm. BREASTS: Symmetric in size. No palpable masses or lymphadenopathy, skin changes, or nipple drainage. ABDOMEN: Soft, nontender, nondistended. No organomegaly. PELVIC: Normal external female genitalia. Vagina is pink and rugated.  Normal discharge. Normal appearing cervix with IUD string extending from os. Uterus is normal in size. No adnexal mass or tenderness. Chaperone present during the pelvic exam EXTREMITIES: No cyanosis, clubbing, or edema, 2+ distal pulses.     Assessment:    Healthy female  exam.      Plan:    Pap smear collected Vaginal swab for STI screening Patient declined blood work Patient will be contacted with abnormal results See After Visit Summary for Counseling Recommendations

## 2020-12-06 LAB — CERVICOVAGINAL ANCILLARY ONLY
Chlamydia: NEGATIVE
Comment: NEGATIVE
Comment: NORMAL
Neisseria Gonorrhea: NEGATIVE

## 2020-12-06 LAB — CYTOLOGY - PAP
Chlamydia: NEGATIVE
Comment: NEGATIVE
Comment: NEGATIVE
Comment: NORMAL
Diagnosis: NEGATIVE
Neisseria Gonorrhea: NEGATIVE
Trichomonas: NEGATIVE

## 2020-12-07 ENCOUNTER — Telehealth: Payer: Self-pay | Admitting: *Deleted

## 2020-12-07 NOTE — Telephone Encounter (Signed)
Pt informed of pap and STD results

## 2020-12-07 NOTE — Telephone Encounter (Signed)
-----   Message from Catalina Antigua, MD sent at 12/07/2020  8:39 AM EDT ----- Please inform patient of negative STD screen and normal pap smear. Next pap smear due in 3 years

## 2021-03-02 ENCOUNTER — Encounter: Payer: Self-pay | Admitting: Obstetrics and Gynecology

## 2022-01-01 ENCOUNTER — Ambulatory Visit (INDEPENDENT_AMBULATORY_CARE_PROVIDER_SITE_OTHER): Payer: BC Managed Care – PPO

## 2022-01-01 ENCOUNTER — Ambulatory Visit: Payer: BC Managed Care – PPO | Admitting: Podiatry

## 2022-01-01 DIAGNOSIS — M722 Plantar fascial fibromatosis: Secondary | ICD-10-CM | POA: Diagnosis not present

## 2022-01-01 NOTE — Progress Notes (Signed)
  Subjective:  Patient ID: Bonnie Cole, female    DOB: [birth year],  MRN: [id number]  Chief Complaint  Patient presents with   Foot Pain    26 y.o. female presents with the above complaint.  Patient presents with bilateral heel pain that has been going for quite some time is progressive gotten worse worse with ambulation worse with pressure.  Pain scale 7 out of 10.  The left side is worse than right side left has been going on for a year.  She has not seen anyone as prior to seeing me she denies any other acute complaints.  She would like to discuss treatment options for this.   Review of Systems: Negative except as noted in the HPI. Denies N/V/F/Ch.  Past Medical History:  Diagnosis Date   Anxiety    Migraine    migraines    Current Outpatient Medications:    lactobacillus acidophilus (BACID) TABS tablet, Take 2 tablets by mouth 3 (three) times daily., Disp: , Rfl:    levonorgestrel (MIRENA) 20 MCG/24HR IUD, 1 each by Intrauterine route once., Disp: , Rfl:   Social History   Tobacco Use  Smoking Status Never  Smokeless Tobacco Never    Allergies  Allergen Reactions   Tetracyclines & Related Hives   Objective:  There were no vitals filed for this visit. There is no height or weight on file to calculate BMI. Constitutional Well developed. Well nourished.  Vascular Dorsalis pedis pulses palpable bilaterally. Posterior tibial pulses palpable bilaterally. Capillary refill normal to all digits.  No cyanosis or clubbing noted. Pedal hair growth normal.  Neurologic Normal speech. Oriented to person, place, and time. Epicritic sensation to light touch grossly present bilaterally.  Dermatologic Nails well groomed and normal in appearance. No open wounds. No skin lesions.  Orthopedic: Normal joint ROM without pain or crepitus bilaterally. No visible deformities. Tender to palpation at the calcaneal tuber bilaterally. No pain with calcaneal squeeze bilaterally. Ankle  ROM diminished range of motion bilaterally. Silfverskiold Test: positive bilaterally.   Radiographs: Taken and reviewed. No acute fractures or dislocations. No evidence of stress fracture.  Plantar heel spur present. Posterior heel spur absent.   Assessment:   1. Plantar fasciitis of right foot   2. Plantar fasciitis of left foot    Plan:  Patient was evaluated and treated and all questions answered.  Plantar Fasciitis, bilaterally - XR reviewed as above.  - Educated on icing and stretching. Instructions given.  - Injection delivered to the plantar fascia as below. - DME: Plantar fascial brace dispensed to support the medial longitudinal arch of the foot and offload pressure from the heel and prevent arch collapse during weightbearing - Pharmacologic management: None  Procedure: Injection Tendon/Ligament Location: Bilateral plantar fascia at the glabrous junction; medial approach. Skin Prep: alcohol Injectate: 0.5 cc 0.5% marcaine plain, 0.5 cc of 1% Lidocaine, 0.5 cc kenalog 10. Disposition: Patient tolerated procedure well. Injection site dressed with a band-aid.  No follow-ups on file.

## 2022-01-29 ENCOUNTER — Ambulatory Visit: Payer: BC Managed Care – PPO | Admitting: Podiatry

## 2022-01-29 DIAGNOSIS — Q666 Other congenital valgus deformities of feet: Secondary | ICD-10-CM

## 2022-01-29 DIAGNOSIS — M722 Plantar fascial fibromatosis: Secondary | ICD-10-CM

## 2022-01-29 NOTE — Progress Notes (Addendum)
  Subjective:  Patient ID: Bonnie Cole, female    DOB: [birth year],  MRN: [id number]  Chief Complaint  Patient presents with   Plantar Fasciitis    Pt stated that she is doing so much better     26 y.o. female presents with the above complaint.  Patient presents with follow-up of bilateral Planter fasciitis.  She states she is doing a lot better she is about 80 to 90% improved.  She would like to discuss next treatment plan.   Review of Systems: Negative except as noted in the HPI. Denies N/V/F/Ch.  Past Medical History:  Diagnosis Date   Anxiety    Migraine    migraines    Current Outpatient Medications:    lactobacillus acidophilus (BACID) TABS tablet, Take 2 tablets by mouth 3 (three) times daily., Disp: , Rfl:    levonorgestrel (MIRENA) 20 MCG/24HR IUD, 1 each by Intrauterine route once., Disp: , Rfl:   Social History   Tobacco Use  Smoking Status Never  Smokeless Tobacco Never    Allergies  Allergen Reactions   Tetracyclines & Related Hives   Objective:  There were no vitals filed for this visit. There is no height or weight on file to calculate BMI. Constitutional Well developed. Well nourished.  Vascular Dorsalis pedis pulses palpable bilaterally. Posterior tibial pulses palpable bilaterally. Capillary refill normal to all digits.  No cyanosis or clubbing noted. Pedal hair growth normal.  Neurologic Normal speech. Oriented to person, place, and time. Epicritic sensation to light touch grossly present bilaterally.  Dermatologic Nails well groomed and normal in appearance. No open wounds. No skin lesions.  Orthopedic: Normal joint ROM without pain or crepitus bilaterally. No visible deformities. Tender to palpation at the calcaneal tuber bilaterally. No pain with calcaneal squeeze bilaterally. Ankle ROM diminished range of motion bilaterally. Silfverskiold Test: positive bilaterally.   Radiographs: Taken and reviewed. No acute fractures or  dislocations. No evidence of stress fracture.  Plantar heel spur present. Posterior heel spur absent.   Assessment:   No diagnosis found.  Plan:  Patient was evaluated and treated and all questions answered.  Plantar Fasciitis, bilaterally - XR reviewed as above.  - Educated on icing and stretching. Instructions given.  -Neck and injection delivered to the plantar fascia as below. - DME: Continue plantar fascial brace.  Patient will to hold off the night splint. - Pharmacologic management: None  Pes planovalgus -I explained to patient the etiology of pes planovalgus and relationship with Planter fasciitis and various treatment options were discussed.  Given patient foot structure in the setting of Planter fasciitis I believe patient will benefit from custom-made orthotics to help control the hindfoot motion support the arch of the foot and take the stress away from plantar fascial.  Patient agrees with the plan like to proceed with orthotics -Patient was casted for orthotics   Procedure: Injection Tendon/Ligament Location: Bilateral plantar fascia at the glabrous junction; medial approach. Skin Prep: alcohol Injectate: 0.5 cc 0.5% marcaine plain, 0.5 cc of 1% Lidocaine, 0.5 cc kenalog 10. Disposition: Patient tolerated procedure well. Injection site dressed with a band-aid.  No follow-ups on file.

## 2022-02-13 ENCOUNTER — Other Ambulatory Visit (HOSPITAL_COMMUNITY)
Admission: RE | Admit: 2022-02-13 | Discharge: 2022-02-13 | Disposition: A | Discharge status: Home / Self Care | Payer: BC Managed Care – PPO | Source: Ambulatory Visit | Attending: Family Medicine | Admitting: Family Medicine

## 2022-02-13 ENCOUNTER — Encounter: Payer: Self-pay | Admitting: Obstetrics & Gynecology

## 2022-02-13 ENCOUNTER — Ambulatory Visit (INDEPENDENT_AMBULATORY_CARE_PROVIDER_SITE_OTHER): Payer: BC Managed Care – PPO | Admitting: Obstetrics & Gynecology

## 2022-02-13 VITALS — BP 139/97 | HR 73 | Ht 64.0 in | Wt 231.0 lb

## 2022-02-13 DIAGNOSIS — Z30432 Encounter for removal of intrauterine contraceptive device: Secondary | ICD-10-CM | POA: Diagnosis not present

## 2022-02-13 DIAGNOSIS — Z113 Encounter for screening for infections with a predominantly sexual mode of transmission: Secondary | ICD-10-CM | POA: Diagnosis not present

## 2022-02-13 DIAGNOSIS — Z01419 Encounter for gynecological examination (general) (routine) without abnormal findings: Secondary | ICD-10-CM

## 2022-02-13 NOTE — Progress Notes (Signed)
GYNECOLOGY ANNUAL PREVENTATIVE CARE ENCOUNTER NOTE  History:     Bonnie Cole is a 26 y.o. G0 female here for a routine annual gynecologic exam.  Current complaints: desires removal of her IUD. Mirena placed in 2017.  No menstrual irregularities or concerns, "just want to see what my body does on its own".   Denies abnormal vaginal bleeding, discharge, pelvic pain, problems with intercourse or other gynecologic concerns.    Gynecologic History No LMP recorded. (Menstrual status: IUD). Contraception: IUD Last Pap: 12/05/2020. Result was normal.   Obstetric History OB History  No obstetric history on file.    Past Medical History:  Diagnosis Date   Anxiety    Migraine    migraines    Past Surgical History:  Procedure Laterality Date   TONSILLECTOMY Bilateral 12/11/2016   Procedure: TONSILLECTOMY;  Surgeon: Graylin Shiver, MD;  Location: [facility];  Service: ENT;  Laterality: Bilateral;   WISDOM TOOTH EXTRACTION      Current Outpatient Medications on File Prior to Visit  Medication Sig Dispense Refill   levonorgestrel (MIRENA) 20 MCG/24HR IUD 1 each by Intrauterine route once.     lactobacillus acidophilus (BACID) TABS tablet Take 2 tablets by mouth 3 (three) times daily. (Patient not taking: Reported on 02/13/2022)     No current facility-administered medications on file prior to visit.    Allergies  Allergen Reactions   Tetracyclines & Related Hives    Social History:  reports that she has never smoked. She has never used smokeless tobacco. She reports current alcohol use. She reports that she does not use drugs.  Family History  Problem Relation Age of Onset   Diabetes Paternal Grandfather    Diabetes Paternal Grandmother    Dementia Paternal Grandmother        Died in her sleep   Heart attack Maternal Grandfather    Hypertension Mother     The following portions of the patient's history were reviewed and updated as appropriate:  allergies, current medications, past family history, past medical history, past social history, past surgical history and problem list.  Review of Systems Pertinent items noted in HPI and remainder of comprehensive ROS otherwise negative.  Physical Exam:  BP (!) 139/97   Pulse 73   Ht 5\' 4"  (1.626 m)   Wt 231 lb (104.8 kg)   BMI 39.65 kg/m  CONSTITUTIONAL: Well-developed, well-nourished female in no acute distress.  HENT:  Normocephalic, atraumatic, External right and left ear normal.  EYES: Conjunctivae and EOM are normal. Pupils are equal, round, and reactive to light. No scleral icterus.  NECK: Normal range of motion, supple, no masses.  Normal thyroid.  SKIN: Skin is warm and dry. No rash noted. Not diaphoretic. No erythema. No pallor. MUSCULOSKELETAL: Normal range of motion. No tenderness.  No cyanosis, clubbing, or edema. NEUROLOGIC: Alert and oriented to person, place, and time. Normal reflexes, muscle tone coordination.  PSYCHIATRIC: Normal mood and affect. Normal behavior. Normal judgment and thought content. CARDIOVASCULAR: Normal heart rate noted, regular rhythm RESPIRATORY: Clear to auscultation bilaterally. Effort and breath sounds normal, no problems with respiration noted. BREASTS: Symmetric in size. No masses, tenderness, skin changes, nipple drainage, or lymphadenopathy bilaterally. Performed in the presence of a chaperone. ABDOMEN: Soft, no distention noted.  No tenderness, rebound or guarding.  PELVIC: Normal appearing external genitalia and urethral meatus; normal appearing vaginal mucosa and cervix.  Mirena strings seen. No abnormal vaginal discharge noted, testing sample obtained.  Pap smear  obtained.  Normal uterine size, no other palpable masses, no uterine or adnexal tenderness.  Performed in the presence of a chaperone.  IUD Removal  Patient identified, informed consent performed, consent signed. During the above pelvic exam, the strings of the IUD were  visualized.  The strings of the IUD were grasped and pulled using ring forceps. The IUD was removed in its entirety.  Patient tolerated the procedure well.     Assessment and Plan:     1. Routine screening for STI (sexually transmitted infection) - Cervicovaginal ancillary only( [place])  done.  Had serum screen at work. Safe sex practices recommended.  2. Encounter for IUD removal Patient will use natural family planning or condoms for contraception for now.  3. Well woman exam with routine gynecological exam Pap is up to date. Routine preventative health maintenance measures emphasized. Please refer to After Visit Summary for other counseling recommendations.      Jaynie Collins, MD, FACOG Obstetrician & Gynecologist, [facility] for [employer], [facility]

## 2022-02-14 LAB — CERVICOVAGINAL ANCILLARY ONLY
Chlamydia: NEGATIVE
Comment: NEGATIVE
Comment: NEGATIVE
Comment: NORMAL
Neisseria Gonorrhea: NEGATIVE
Trichomonas: NEGATIVE

## 2022-02-26 ENCOUNTER — Ambulatory Visit: Payer: BC Managed Care – PPO | Admitting: Podiatry

## 2022-02-26 DIAGNOSIS — M722 Plantar fascial fibromatosis: Secondary | ICD-10-CM

## 2022-03-05 NOTE — Progress Notes (Signed)
  Subjective:  Patient ID: Bonnie Cole, female    DOB: 06-Dec-1995,  MRN: [id number]  No chief complaint on file.   26 y.o. female presents with the above complaint.  Patient presents with follow-up of bilateral Planter fasciitis.  She is she is doing a lot better the injection helped considerably.  She is waiting for orthotics.   Review of Systems: Negative except as noted in the HPI. Denies N/V/F/Ch.  Past Medical History:  Diagnosis Date   Anxiety    Migraine    migraines   No current outpatient medications on file.  Social History   Tobacco Use  Smoking Status Never  Smokeless Tobacco Never    Allergies  Allergen Reactions   Tetracyclines & Related Hives   Objective:  There were no vitals filed for this visit. There is no height or weight on file to calculate BMI. Constitutional Well developed. Well nourished.  Vascular Dorsalis pedis pulses palpable bilaterally. Posterior tibial pulses palpable bilaterally. Capillary refill normal to all digits.  No cyanosis or clubbing noted. Pedal hair growth normal.  Neurologic Normal speech. Oriented to person, place, and time. Epicritic sensation to light touch grossly present bilaterally.  Dermatologic Nails well groomed and normal in appearance. No open wounds. No skin lesions.  Orthopedic: Normal joint ROM without pain or crepitus bilaterally. No visible deformities. No further tender to palpation at the calcaneal tuber bilaterally. No pain with calcaneal squeeze bilaterally. Ankle ROM diminished range of motion bilaterally. Silfverskiold Test: positive bilaterally.   Radiographs: Taken and reviewed. No acute fractures or dislocations. No evidence of stress fracture.  Plantar heel spur present. Posterior heel spur absent.   Assessment:   No diagnosis found.  Plan:  Patient was evaluated and treated and all questions answered.  Plantar Fasciitis, bilaterally -Clinically healed and doing much better.  At  this time I discussed shoe gear modification importance of orthotics.  When she gets the orthotics she will break them in.  If any foot and ankle issues on future advised her to come back and see me.  She states understanding  Pes planovalgus -I explained to patient the etiology of pes planovalgus and relationship with Planter fasciitis and various treatment options were discussed.  Given patient foot structure in the setting of Planter fasciitis I believe patient will benefit from custom-made orthotics to help control the hindfoot motion support the arch of the foot and take the stress away from plantar fascial.  Patient agrees with the plan like to proceed with orthotics -Patient was casted for orthotics    No follow-ups on file.

## 2022-03-07 ENCOUNTER — Telehealth: Payer: Self-pay | Admitting: Podiatry

## 2022-03-07 NOTE — Telephone Encounter (Signed)
Left message on machine that orthotics are in - currently in [place] being sent to [facility]  03/08/22   No balance

## 2022-05-24 ENCOUNTER — Ambulatory Visit (INDEPENDENT_AMBULATORY_CARE_PROVIDER_SITE_OTHER): Payer: BC Managed Care – PPO | Admitting: Podiatry

## 2022-05-24 DIAGNOSIS — Q666 Other congenital valgus deformities of feet: Secondary | ICD-10-CM

## 2022-05-24 NOTE — Progress Notes (Signed)
Orthotics were picked up and they are functioning and fitting well.  I discussed the break-in period as well.  If any foot and ankle issues arises she will come back and see me

## 2022-08-01 DIAGNOSIS — D2239 Melanocytic nevi of other parts of face: Secondary | ICD-10-CM | POA: Diagnosis not present

## 2023-12-02 ENCOUNTER — Encounter: Payer: Self-pay | Admitting: Physician Assistant

## 2023-12-02 ENCOUNTER — Other Ambulatory Visit (INDEPENDENT_AMBULATORY_CARE_PROVIDER_SITE_OTHER): Payer: Self-pay

## 2023-12-02 ENCOUNTER — Ambulatory Visit: Admitting: Physician Assistant

## 2023-12-02 DIAGNOSIS — M545 Low back pain, unspecified: Secondary | ICD-10-CM

## 2023-12-02 DIAGNOSIS — G8929 Other chronic pain: Secondary | ICD-10-CM

## 2023-12-02 MED ORDER — METHOCARBAMOL 500 MG PO TABS: 500.0000 mg | ORAL_TABLET | Freq: Four times a day (QID) | ORAL | 0 refills | Status: AC | PRN

## 2023-12-02 MED ORDER — METHYLPREDNISOLONE 4 MG PO TBPK: ORAL_TABLET | ORAL | 0 refills | Status: AC

## 2023-12-02 NOTE — Progress Notes (Signed)
 Office Visit Note   Patient: Bonnie Cole           Date of Birth: 24-Apr-1995           MRN: [id number] Visit Date: 12/02/2023              Requested by: No referring provider defined for this encounter. PCP: Patient, No Pcp Per   Assessment & Plan: Visit Diagnoses:  1. Chronic bilateral low back pain without sciatica     Plan: Patient is a pleasant 28 year old woman with a 59-month history of some lower back pain mostly on the right side does not have any radicular symptoms.  She works as a Engineer, civil (consulting) and critical care.  She does not recall doing anything particular.  Over the weekend this got significantly worse.  Denies any loss of bowel or bladder control.  Her x-rays are reassuring and her exam points to more of a muscular origin without radiation of symptoms.  We did try her on a Medrol  Dosepak as well as a muscle relaxant for when she is not working.  She knows not to take anti-inflammatories when she is on the Dosepak.  Was informed of side effects.  Would also like her to get engaged with PT to learn low back exercises.  Should call me if things worsen or otherwise will follow-up in 4 weeks have provided a note for her for the next 2 weeks to remain on light duty  Follow-Up Instructions: No follow-ups on file.   Orders:  Orders Placed This Encounter  Procedures   XR Lumbar Spine 2-3 Views   No orders of the defined types were placed in this encounter.     Procedures: No procedures performed   Clinical Data: No additional findings.   Subjective: Chief Complaint  Patient presents with   Lower Back - Pain    HPI patient is a pleasant 28 year old who works as a Engineer, civil (consulting).  She comes in with a 69-month though significantly to date increasing pain in her lower back and her lower posterior buttock.  She denies any radiation of symptoms down her leg any loss of bowel or bladder control.  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  She has tried anti-inflammatories which helped a little bit but over the weekend this got much worse  Review of Systems  All other systems reviewed and are negative.    Objective: Vital Signs: There were no vitals taken for this visit.  Physical Exam Constitutional:      Appearance: Normal appearance.  HENT:     Head: Normocephalic.  Pulmonary:     Effort: Pulmonary effort is normal.  Skin:    General: Skin is warm and dry.  Neurological:     General: No focal deficit present.     Mental Status: She is alert and oriented to person, place, and time.  Psychiatric:        Mood and Affect: Mood normal.        Behavior: Behavior normal.     Ortho Exam Examination of her lower spine she does have tightness in the paravertebral muscles especially on the right.  Pain is reproduced with forward flexion and extension.  She has straight leg raise but her strength is excellent with dorsiflexion plantarflexion extension and flexion of her legs and flexion of her hips.  No pain with manipulation of her hip Specialty Comments:  No specialty comments available.  Imaging: No results found.   PMFS History: Patient Active Problem List   Diagnosis Date Noted   Migraine with aura and without status migrainosus, not intractable 11/17/2014   Complicated migraine 11/17/2014   Obesity 11/17/2014   Episodic tension type headache 05/19/2013   Migraine without aura 05/19/2013   History of migraine headaches 06/01/2012   Dysautonomia (HCC) 06/01/2012   Syncope 06/01/2012   Anxiety 06/01/2012   Past Medical History:  Diagnosis Date   Anxiety    Migraine    migraines    Family History  Problem Relation Age of Onset   Diabetes Paternal Grandfather    Diabetes Paternal Grandmother    Dementia Paternal Grandmother        Died in her sleep   Heart attack Maternal Grandfather    Hypertension Mother     Past Surgical History:  Procedure Laterality Date    TONSILLECTOMY Bilateral 12/11/2016   Procedure: TONSILLECTOMY;  Surgeon: Terri Alan PARAS, MD;  Location: [facility];  Service: ENT;  Laterality: Bilateral;   WISDOM TOOTH EXTRACTION     Social History   Occupational History   Not on file  Tobacco Use   Smoking status: Never   Smokeless tobacco: Never  Vaping Use   Vaping status: Never Used  Substance and Sexual Activity   Alcohol use: Yes    Comment: Socially on weekends   Drug use: No   Sexual activity: Yes    Birth control/protection: I.U.D.

## 2023-12-15 NOTE — Therapy (Signed)
 OUTPATIENT PHYSICAL THERAPY EVALUATION   Patient Name: Bonnie Cole MRN: [id number] DOB:[birth year], 28 y.o., female Today's Date: 12/16/2023  END OF SESSION:  PT End of Session - 12/16/23 1104     Visit Number 1    Number of Visits 20    Date for Recertification  02/24/24    Authorization Type BCBS    Progress Note Due on Visit 10    PT Start Time 1105    PT Stop Time 1137    PT Time Calculation (min) 32 min    Activity Tolerance Patient limited by pain    Behavior During Therapy [facility] for tasks assessed/performed          Past Medical History:  Diagnosis Date   Anxiety    Migraine    migraines   Past Surgical History:  Procedure Laterality Date   TONSILLECTOMY Bilateral 12/11/2016   Procedure: TONSILLECTOMY;  Surgeon: Terri Alan JINNY, MD;  Location: [facility];  Service: ENT;  Laterality: Bilateral;   WISDOM TOOTH EXTRACTION     Patient Active Problem List   Diagnosis Date Noted   Migraine with aura and without status migrainosus, not intractable 11/17/2014   Complicated migraine 11/17/2014   Obesity 11/17/2014   Episodic tension type headache 05/19/2013   Migraine without aura 05/19/2013   History of migraine headaches 06/01/2012   Dysautonomia (HCC) 06/01/2012   Syncope 06/01/2012   Anxiety 06/01/2012    PCP: No PCP listed  REFERRING PROVIDER: Persons, Ronal Dragon, PA  REFERRING DIAG: M54.50,G89.29 (ICD-10-CM) - Chronic bilateral low back pain without sciatica  Rationale for Evaluation and Treatment: Rehabilitation  THERAPY DIAG:  Other low back pain  Muscle weakness (generalized)  ONSET DATE: Acute on chronic 11/2023  SUBJECTIVE:                                                                                                                                                                                           SUBJECTIVE STATEMENT: Insidious onset of symptoms > 6 months ago per PA note. Pt indicated history of off and on back  pain.  Pt indicated waking up one day with increased stiffness/tightness with pain in bending, sitting prolonged.  Reported variable symptoms.  Muscle relaxer have helped.  Pt indicated back only symptoms.  Reported Rt side more than Lt but when symptoms are worsened its both sides.  Denied numbness/tingling complaints, bowel/bladder control.  Does impact sleep.   PERTINENT HISTORY:  Anxiety, migraine  PAIN:  NPRS scale: current 6/10, at worst 8/10 Pain location: back pain Pain description: occasional throbbing, aching, tighter. Aggravating factors: bending forward, sitting prolonged, twisting, prolonged  walking.  Relieving factors: muscle relaxers, heat.   PRECAUTIONS: None  WEIGHT BEARING RESTRICTIONS: No  FALLS:  Has patient fallen in last 6 months? 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  Needs to lift and turn patients.   PLOF: Independent, going to gym (not since pain started), knit/reading.   PATIENT GOALS: Reduce pain.    OBJECTIVE:   DIAGNOSTIC FINDINGS:  Lumbar xrays from 12/02/2023: 2 view radiographs of the lumbar spine well-maintained alignment and  spacing.  No evidence of any significant arthropathy   PATIENT SURVEYS:  Patient-Specific Activity Scoring Scheme  0 represents "unable to perform." 10 represents "able to perform at prior level. 0 1 2 3 4 5 6 7 8 9  10 (Date and Score)   Activity Eval  12/16/2023    1. Bending   4    2. Sitting prolonged  5    3. Standing/walking for work  7   4. Gym  0   5. Patient mobility assistance 0   Score 3.2 avg    Total score = sum of the activity scores/number of activities Minimum detectable change (90%CI) for average score = 2 points Minimum detectable change (90%CI) for single activity score = 3 points  SCREENING FOR RED FLAGS: 12/16/2023 Bowel or bladder incontinence: No Cauda equina syndrome:  No  COGNITION: 12/16/2023 Overall cognitive status: WFL normal      SENSATION: 12/16/2023 Unremarkable   MUSCLE LENGTH: 12/16/2023 Passive SLR > 80 deg bilateral with back pain noted.   POSTURE:  12/16/2023 Unremarkable.  No lateral shift noted.   PALPATION: 12/16/2023 Tenderness in bilateral QL, lumbar paraspinals.   LUMBAR ROM:  12/16/2023 Directional Preference Assessment: Centralization: not observed Peripheralization:  not observed  AROM Eval 12/16/2023  Flexion Mid tight with pain increase, gower sign upon return.   Extension 50% WFL with pain lumbar  Repeated x5 similar low back pain, improved 75%  Right lateral flexion   Left lateral flexion   Right rotation   Left rotation    (Blank rows = not tested)  LOWER EXTREMITY ROM:      Right Eval 12/16/2023 Left Eval 12/16/2023  Hip flexion 114 AROM in supine with pain 130 AROM in supine with pain  Hip extension    Hip abduction    Hip adduction    Hip internal rotation    Hip external rotation    Knee flexion    Knee extension    Ankle dorsiflexion    Ankle plantarflexion    Ankle inversion    Ankle eversion     (Blank rows = not tested)  LOWER EXTREMITY MMT:    MMT Right Eval 12/16/2023 Left Eval 12/16/2023  Hip flexion 5/5 5/5  Hip extension 3+/5 c pain 4/5 c pain (less than Rt)  Hip abduction    Hip adduction    Hip internal rotation    Hip external rotation    Knee flexion 5/5 5/5  Knee extension 5/5 5/5  Ankle dorsiflexion    Ankle plantarflexion             (Blank rows = not tested)  12/16/2023:  Lumbar flexion 3/5 with pain  SPECIAL TESTS:  12/16/2023 (-) slump, crossed slr for radicular symptoms  FUNCTIONAL TESTS:  12/16/2023 18 inch chair transfer s UE assist with pain noted.   GAIT: 12/16/2023 Independent ambulation  TODAY'S TREATMENT:                                                                                                         DATE: 12/16/2023  Therex:    HEP instruction/performance c cues for techniques, handout provided.  Trial set performed of each for comprehension and symptom assessment.  See below for exercise list  Manual Percussive device to Lt and Rt lumbar in prone.   PATIENT EDUCATION:  12/16/2023 Education details: HEP, POC Person educated: Patient Education method: Programmer, multimedia, Demonstration, Verbal cues, and Handouts Education comprehension: verbalized understanding, returned demonstration, and verbal cues required  HOME EXERCISE PROGRAM: Access Code: [email or web address] URL: [email or web address] Date: 12/16/2023 Prepared by: Ozell Silvan  Exercises - Supine Lower Trunk Rotation  - 2-3 x daily - 7 x weekly - 1 sets - 3-5 reps - 15 hold - Hooklying Single Knee to Chest Stretch  - 2 x daily - 7 x weekly - 1 sets - 5 reps - 15 hold - Supine Bridge  - 1-2 x daily - 7 x weekly - 1-2 sets - 10 reps - 2 hold - Seated Multifidi Isometric  - 1-2 x daily - 7 x weekly - 1 sets - 10 reps - 5-10 hold - Seated Abdominal Press into [employer]  - 1-2 x daily - 7 x weekly - 1 sets - 10 reps - 5-10 hold  ASSESSMENT:  CLINICAL IMPRESSION: Patient is a 28 y.o. who comes to clinic with complaints of back pain with mobility, strength and movement coordination deficits that impair their ability to perform usual daily and recreational functional activities without increase difficulty/symptoms at this time.  Patient to benefit from skilled PT services to address impairments and limitations to improve to previous level of function without restriction secondary to condition.   OBJECTIVE IMPAIRMENTS: decreased activity tolerance, decreased coordination, decreased endurance, decreased mobility, difficulty walking, decreased ROM, decreased strength,  increased fascial restrictions, impaired perceived functional ability, increased muscle spasms, impaired flexibility, improper body mechanics, and pain.   ACTIVITY LIMITATIONS: carrying, lifting, bending, sitting, standing, squatting, sleeping, transfers, locomotion level, and caring for others  PARTICIPATION LIMITATIONS: meal prep, cleaning, laundry, interpersonal relationship, shopping, community activity, and occupation  PERSONAL FACTORS: Anxiety, migraine are also affecting patient's functional outcome.   REHAB POTENTIAL: Good  CLINICAL DECISION MAKING: Stable/uncomplicated  EVALUATION COMPLEXITY: Low   GOALS: Goals reviewed with patient? Yes  SHORT TERM GOALS: (target date for Short term goals are 3 weeks 01/06/2024)  1. Patient will demonstrate independent use of home exercise program to maintain progress from in clinic treatments.  Goal status: New  LONG TERM GOALS: (target dates for all long term goals are 10 weeks  02/24/2024 )   1. Patient will demonstrate/report pain at worst less than or equal to 2/10 to facilitate minimal limitation in daily activity secondary to pain symptoms.  Goal status: New   2. Patient will demonstrate independent use of home exercise program to facilitate ability to maintain/progress functional gains from skilled physical therapy services.  Goal status: New   3. Patient will  demonstrate Patient specific functional scale avg > or = 8/10 to indicate reduced disability due to condition.   Goal status: New   4. Patient will demonstrate lumbar extension 100 % WFL s symptoms to facilitate upright standing, walking posture at PLOF s limitation.  Goal status: New   5.  Patient will demonstrate lumbar flexion MMT > 4/5 to facilitate stability for daily activity.   Goal status: New   6.  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  Goal status: New    PLAN:  PT FREQUENCY: 1-2x/week  PT DURATION: 10 weeks  PLANNED  INTERVENTIONS: Can include 02853- PT Re-evaluation, 97110-Therapeutic exercises, 97530- Therapeutic activity, [id number]- Neuromuscular re-education, 97535- Self Care, 97140- Manual therapy, [id number]- Gait training, [phone]- Orthotic Fit/training, [phone]- Canalith repositioning, [id number]- Aquatic Therapy, [phone]- Electrical stimulation (unattended), [id number] Physical performance testing, 97016- Vasopneumatic device, [id number]- Ultrasound, [id number]- Traction (mechanical), [id number]- Ionotophoresis 4mg /ml Dexamethasone ,  [id number] - Needle insertion w/o injection 1 or 2 muscles, 20561 - Needle insertion w/o injection 3 or more muscles.    Patient/Family education, Balance training, Stair training, Taping, Dry Needling, Joint mobilization, Joint manipulation, Spinal manipulation, Spinal mobilization, Scar mobilization, Vestibular training, Visual/preceptual remediation/compensation, DME instructions, Cryotherapy, and Moist heat.  All performed as medically necessary.  All included unless contraindicated  PLAN FOR NEXT SESSION: Review HEP knowledge/results.  Myofascial release techniques as necessary.  Build postural support /core strengthening as symptoms allow.    Ozell Silvan, PT, DPT, OCS, ATC 12/16/23  12:02 PM

## 2023-12-16 ENCOUNTER — Ambulatory Visit: Admitting: Rehabilitative and Restorative Service Providers"

## 2023-12-16 ENCOUNTER — Encounter: Payer: Self-pay | Admitting: Rehabilitative and Restorative Service Providers"

## 2023-12-16 DIAGNOSIS — M5459 Other low back pain: Secondary | ICD-10-CM | POA: Diagnosis not present

## 2023-12-16 DIAGNOSIS — M6281 Muscle weakness (generalized): Secondary | ICD-10-CM

## 2023-12-30 ENCOUNTER — Ambulatory Visit: Admitting: Rehabilitative and Restorative Service Providers"

## 2023-12-30 ENCOUNTER — Encounter: Payer: Self-pay | Admitting: Rehabilitative and Restorative Service Providers"

## 2023-12-30 ENCOUNTER — Ambulatory Visit: Admitting: Physician Assistant

## 2023-12-30 DIAGNOSIS — M545 Low back pain, unspecified: Secondary | ICD-10-CM | POA: Insufficient documentation

## 2023-12-30 DIAGNOSIS — G8929 Other chronic pain: Secondary | ICD-10-CM | POA: Diagnosis not present

## 2023-12-30 DIAGNOSIS — M5459 Other low back pain: Secondary | ICD-10-CM | POA: Diagnosis not present

## 2023-12-30 DIAGNOSIS — M5441 Lumbago with sciatica, right side: Secondary | ICD-10-CM | POA: Diagnosis not present

## 2023-12-30 DIAGNOSIS — M6281 Muscle weakness (generalized): Secondary | ICD-10-CM | POA: Diagnosis not present

## 2023-12-30 NOTE — Therapy (Addendum)
 " OUTPATIENT PHYSICAL THERAPY TREATMENT / DISCHARGE   Patient Name: Bonnie Cole MRN: [id number] DOB:1995-11-05, 28 y.o., female Today's Date: 12/30/2023  END OF SESSION:  PT End of Session - 12/30/23 1306     Visit Number 2    Number of Visits 20    Date for Recertification  02/24/24    Authorization Type BC[employer]    Progress Note Due on Visit 10    PT Start Time 1259    PT Stop Time 1323    PT Time Calculation (min) 24 min    Activity Tolerance Patient tolerated treatment well    Behavior During Therapy WFL for tasks assessed/performed           Past Medical History:  Diagnosis Date   Anxiety    Migraine    migraines   Past Surgical History:  Procedure Laterality Date   TONSILLECTOMY Bilateral 12/11/2016   Procedure: TONSILLECTOMY;  Surgeon: Terri Alan JINNY, MD;  Location: [facility];  Service: ENT;  Laterality: Bilateral;   WISDOM TOOTH EXTRACTION     Patient Active Problem List   Diagnosis Date Noted   Low back pain 12/30/2023   Migraine with aura and without status migrainosus, not intractable 11/17/2014   Complicated migraine 11/17/2014   Obesity 11/17/2014   Episodic tension type headache 05/19/2013   Migraine without aura 05/19/2013   History of migraine headaches 06/01/2012   Dysautonomia (HCC) 06/01/2012   Syncope 06/01/2012   Anxiety 06/01/2012    PCP: No PCP listed  REFERRING PROVIDER: Persons, Ronal Dragon, PA  REFERRING DIAG: M54.50,G89.29 (ICD-10-CM) - Chronic bilateral low back pain without sciatica  Rationale for Evaluation and Treatment: Rehabilitation  THERAPY DIAG:  Other low back pain  Muscle weakness (generalized)  ONSET DATE: Acute on chronic 11/2023  SUBJECTIVE:                                                                                                                                                                                           SUBJECTIVE STATEMENT: Pt indicated feeling so much better than  before.  Reported some complaints at end of long days.    Reported a little pain at the moment.   PERTINENT HISTORY:  Anxiety, migraine  PAIN:  NPRS scale: at worst 3-4/10.  Pain location: back pain Pain description: occasional throbbing, aching, tighter. Aggravating factors: bending forward, sitting prolonged, twisting, prolonged walking.  Relieving factors: muscle relaxers, heat.   PRECAUTIONS: None  WEIGHT BEARING RESTRICTIONS: No  FALLS:  Has patient fallen in last 6 months? No  LIVING ENVIRONMENT:  Lives in: House/apartment  OCCUPATION: Works as engineer, civil (consulting).  On light duty at this time until end of this week.  Needs to lift and turn patients.   PLOF: Independent, going to gym (not since pain started), knit/reading.   PATIENT GOALS: Reduce pain.    OBJECTIVE:   DIAGNOSTIC FINDINGS:  Lumbar xrays from 12/02/2023: 2 view radiographs of the lumbar spine well-maintained alignment and  spacing.  No evidence of any significant arthropathy   PATIENT SURVEYS:  Patient-Specific Activity Scoring Scheme  0 represents unable to perform. 10 represents able to perform at prior level. 0 1 2 3 4 5 6 7 8 9  10 (Date and Score)   Activity Eval  12/16/2023  12/30/2023  1. Bending   4  10  2. Sitting prolonged  5  10  3. Standing/walking for work  7 10  4. Gym  0 8.5  5. Patient mobility assistance 0 9  Score 3.2 avg 9.5 avg   Total score = sum of the activity scores/number of activities Minimum detectable change (90%CI) for average score = 2 points Minimum detectable change (90%CI) for single activity score = 3 points  SCREENING FOR RED FLAGS: 12/16/2023 Bowel or bladder incontinence: No Cauda equina syndrome: No  COGNITION: 12/16/2023 Overall cognitive status: WFL normal      SENSATION: 12/16/2023 Unremarkable   MUSCLE LENGTH: 12/16/2023 Passive SLR > 80 deg bilateral with back pain noted.   POSTURE:  12/16/2023 Unremarkable.  No lateral shift noted.    PALPATION: 12/16/2023 Tenderness in bilateral QL, lumbar paraspinals.   LUMBAR ROM:  12/16/2023 Directional Preference Assessment: Centralization: not observed Peripheralization:  not observed  AROM Eval 12/16/2023 12/30/2023  Flexion Mid tight with pain increase, gower sign upon return.  To floor no complaints.   Extension 50% WFL with pain lumbar  Repeated x5 similar low back pain, improved 75% 100% WFL  Right lateral flexion    Left lateral flexion    Right rotation    Left rotation     (Blank rows = not tested)  LOWER EXTREMITY ROM:      Right Eval 12/16/2023 Left Eval 12/16/2023  Hip flexion 114 AROM in supine with pain 130 AROM in supine with pain  Hip extension    Hip abduction    Hip adduction    Hip internal rotation    Hip external rotation    Knee flexion    Knee extension    Ankle dorsiflexion    Ankle plantarflexion    Ankle inversion    Ankle eversion     (Blank rows = not tested)  LOWER EXTREMITY MMT:    MMT Right Eval 12/16/2023 Left Eval 12/16/2023  Hip flexion 5/5 5/5  Hip extension 3+/5 c pain 4/5 c pain (less than Rt)  Hip abduction    Hip adduction    Hip internal rotation    Hip external rotation    Knee flexion 5/5 5/5  Knee extension 5/5 5/5  Ankle dorsiflexion    Ankle plantarflexion             (Blank rows = not tested)  12/16/2023:  Lumbar flexion 3/5 with pain  SPECIAL TESTS:  12/16/2023 (-) slump, crossed slr for radicular symptoms  FUNCTIONAL TESTS:  12/16/2023 18 inch chair transfer s UE assist with pain noted.   GAIT: 12/16/2023 Independent ambulation  TODAY'S TREATMENT:                                                                                                         DATE: 12/30/2023  Therex: Review  of HEP with updated handout.  Prone opposite arm//leg lift 3 sec hold x 10 bilateral  Standing lumbar extension x 5   Manual Percussive device to Lt and Rt lumbar in prone.    TODAY'S TREATMENT:                                                                                                         DATE: 12/16/2023  Therex:    HEP instruction/performance c cues for techniques, handout provided.  Trial set performed of each for comprehension and symptom assessment.  See below for exercise list  Manual Percussive device to Lt and Rt lumbar in prone.   PATIENT EDUCATION:  12/16/2023 Education details: HEP, POC Person educated: Patient Education method: Programmer, Multimedia, Demonstration, Verbal cues, and Handouts Education comprehension: verbalized understanding, returned demonstration, and verbal cues required  HOME EXERCISE PROGRAM: Access Code: [email or web address] URL: [email or web address] Date: 12/30/2023 Prepared by: Ozell Silvan  Exercises - Supine Lower Trunk Rotation  - 2-3 x daily - 7 x weekly - 1 sets - 3-5 reps - 15 hold - Hooklying Single Knee to Chest Stretch  - 2 x daily - 7 x weekly - 1 sets - 5 reps - 15 hold - Supine Bridge  - 1-2 x daily - 7 x weekly - 1-2 sets - 10 reps - 2 hold - Seated Multifidi Isometric  - 1-2 x daily - 7 x weekly - 1 sets - 10 reps - 5-10 hold - Seated Abdominal Press into [employer]  - 1-2 x daily - 7 x weekly - 1 sets - 10 reps - 5-10 hold - Prone Alternating Arm and Leg Lifts  - 1-2 x daily - 7 x weekly - 1-2 sets - 10 reps - 3 hold  ASSESSMENT:  CLINICAL IMPRESSION: At this time, Pt indicated good improvement and noted PSFS improvement.  See objective data for updated information.  At this time, agreement made to trial HEP with return to clinic prn.   OBJECTIVE IMPAIRMENTS: decreased activity tolerance, decreased coordination, decreased endurance, decreased mobility, difficulty walking, decreased ROM, decreased strength, increased fascial  restrictions, impaired perceived functional ability, increased muscle spasms, impaired flexibility, improper body mechanics, and pain.   ACTIVITY LIMITATIONS: carrying, lifting, bending, sitting, standing, squatting, sleeping, transfers, locomotion level, and caring for others  PARTICIPATION LIMITATIONS: meal prep, cleaning, laundry, interpersonal relationship, shopping, community activity, and occupation  PERSONAL FACTORS: Anxiety, migraine are also  affecting patient's functional outcome.   REHAB POTENTIAL: Good  CLINICAL DECISION MAKING: Stable/uncomplicated  EVALUATION COMPLEXITY: Low   GOALS: Goals reviewed with patient? Yes  SHORT TERM GOALS: (target date for Short term goals are 3 weeks 01/06/2024)  1. Patient will demonstrate independent use of home exercise program to maintain progress from in clinic treatments.  Goal status: Met 12/30/2023  LONG TERM GOALS: (target dates for all long term goals are 10 weeks  02/24/2024 )   1. Patient will demonstrate/report pain at worst less than or equal to 2/10 to facilitate minimal limitation in daily activity secondary to pain symptoms.  Goal status: Met 12/30/2023   2. Patient will demonstrate independent use of home exercise program to facilitate ability to maintain/progress functional gains from skilled physical therapy services.  Goal status: Met 12/30/2023   3. Patient will demonstrate Patient specific functional scale avg > or = 8/10 to indicate reduced disability due to condition.   Goal status: Met 12/30/2023   4. Patient will demonstrate lumbar extension 100 % WFL s symptoms to facilitate upright standing, walking posture at PLOF s limitation.  Goal status: Met 12/30/2023   5.  Patient will demonstrate lumbar flexion MMT > 4/5 to facilitate stability for daily activity.   Goal status:  not measured yet -12/30/2023   6.  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  Goal status: Met 12/30/2023     PLAN:  PT FREQUENCY: 1-2x/week  PT DURATION: 10 weeks  PLANNED INTERVENTIONS: Can include 02853- PT Re-evaluation, 97110-Therapeutic exercises, 97530- Therapeutic activity, 97112- Neuromuscular re-education, 97535- Self Care, 97140- Manual therapy, [phone]- Gait training, [phone]- Orthotic Fit/training, [phone]- Canalith repositioning, [id number]- Aquatic Therapy, [phone]- Electrical stimulation (unattended), [id number] Physical performance testing, 97016- Vasopneumatic device, [id number]- Ultrasound, [id number]- Traction (mechanical), [id number]- Ionotophoresis 4mg /ml Dexamethasone ,  [id number] - Needle insertion w/o injection 1 or 2 muscles, 20561 - Needle insertion w/o injection 3 or more muscles.    Patient/Family education, Balance training, Stair training, Taping, Dry Needling, Joint mobilization, Joint manipulation, Spinal manipulation, Spinal mobilization, Scar mobilization, Vestibular training, Visual/preceptual remediation/compensation, DME instructions, Cryotherapy, and Moist heat.  All performed as medically necessary.  All included unless contraindicated  PLAN FOR NEXT SESSION: Trial HEP   Ozell Silvan, PT, DPT, OCS, ATC 12/30/23  1:25 PM  PHYSICAL THERAPY DISCHARGE SUMMARY  Visits from Start of Care: 2  Current functional level related to goals / functional outcomes: See note   Remaining deficits: See note   Education / Equipment: HEP  Patient goals were met. Patient is being discharged due to meeting the stated rehab goals.   Ozell Silvan, PT, DPT, OCS, ATC 04/07/24  11:30 AM    "

## 2023-12-30 NOTE — Progress Notes (Signed)
 Office Visit Note   Patient: Bonnie Cole           Date of Birth: 06-Jun-1995           MRN: [id number] Visit Date: 12/30/2023              Requested by: No referring provider defined for this encounter. PCP: Patient, No Pcp Per  No chief complaint on file.     HPI: Patient is a 28 year old woman who follows up on her low back pain radiating to the right leg.  She actually is doing much better.  She did engage with physical therapy and states the Medrol  Dosepak helped her quite a bit.  She does occasionally have some back pain after her shift at work but this is normal for her.  Assessment & Plan: Visit Diagnoses:  1. Chronic bilateral low back pain with right-sided sciatica     Plan: Low back pain she may follow-up as needed I reminded her to continue to keep her back and core strong.  If she has any concerns she can follow-up with me  Follow-Up Instructions: Return if symptoms worsen or fail to improve.   Ortho Exam  Patient is alert, oriented, no adenopathy, well-dressed, normal affect, normal respiratory effort. Examination of her low back she has no tenderness she has good strength she is has no paresthesias has good range of motion    Imaging: No results found. No images are attached to the encounter.  Labs: No results found for: HGBA1C, ESRSEDRATE, CRP, LABURIC, REPTSTATUS, GRAMSTAIN, CULT, LABORGA   No results found for: ALBUMIN, PREALBUMIN, CBC  No results found for: MG No results found for: VD25OH  No results found for: PREALBUMIN    Latest Ref Rng & Units 05/28/2012   10:16 AM  CBC EXTENDED  Hemoglobin 12.0 - 15.0 g/dL 86.0   HCT 63.9 - 53.9 % 41.0      There is no height or weight on file to calculate BMI.  Orders:  No orders of the defined types were placed in this encounter.  No orders of the defined types were placed in this encounter.    Procedures: No procedures performed  Clinical Data: No  additional findings.  ROS:  All other systems negative, except as noted in the HPI. Review of Systems  Objective: Vital Signs: There were no vitals taken for this visit.  Specialty Comments:  No specialty comments available.  PMFS History: Patient Active Problem List   Diagnosis Date Noted   Low back pain 12/30/2023   Migraine with aura and without status migrainosus, not intractable 11/17/2014   Complicated migraine 11/17/2014   Obesity 11/17/2014   Episodic tension type headache 05/19/2013   Migraine without aura 05/19/2013   History of migraine headaches 06/01/2012   Dysautonomia (HCC) 06/01/2012   Syncope 06/01/2012   Anxiety 06/01/2012   Past Medical History:  Diagnosis Date   Anxiety    Migraine    migraines    Family History  Problem Relation Age of Onset   Diabetes Paternal Grandfather    Diabetes Paternal Grandmother    Dementia Paternal Grandmother        Died in her sleep   Heart attack Maternal Grandfather    Hypertension Mother     Past Surgical History:  Procedure Laterality Date   TONSILLECTOMY Bilateral 12/11/2016   Procedure: TONSILLECTOMY;  Surgeon: Terri Alan JINNY, MD;  Location: [facility];  Service: ENT;  Laterality: Bilateral;   WISDOM  TOOTH EXTRACTION     Social History   Occupational History   Not on file  Tobacco Use   Smoking status: Never   Smokeless tobacco: Never  Vaping Use   Vaping status: Never Used  Substance and Sexual Activity   Alcohol use: Yes    Comment: Socially on weekends   Drug use: No   Sexual activity: Yes    Birth control/protection: I.U.D.

## 2024-01-09 ENCOUNTER — Encounter: Admitting: Rehabilitative and Restorative Service Providers"

## 2024-01-12 ENCOUNTER — Encounter: Payer: Self-pay | Admitting: Radiology

## 2024-01-13 ENCOUNTER — Encounter: Admitting: Rehabilitative and Restorative Service Providers"

## 2024-01-19 DIAGNOSIS — Z111 Encounter for screening for respiratory tuberculosis: Secondary | ICD-10-CM | POA: Diagnosis not present

## 2024-01-20 ENCOUNTER — Encounter: Admitting: Rehabilitative and Restorative Service Providers"

## 2024-01-21 DIAGNOSIS — Z111 Encounter for screening for respiratory tuberculosis: Secondary | ICD-10-CM | POA: Diagnosis not present

## 2024-01-21 DIAGNOSIS — Z0000 Encounter for general adult medical examination without abnormal findings: Secondary | ICD-10-CM | POA: Diagnosis not present

## 2024-01-27 DIAGNOSIS — Z111 Encounter for screening for respiratory tuberculosis: Secondary | ICD-10-CM | POA: Diagnosis not present

## 2024-01-29 ENCOUNTER — Encounter: Admitting: Rehabilitative and Restorative Service Providers"

## 2024-01-30 DIAGNOSIS — Z111 Encounter for screening for respiratory tuberculosis: Secondary | ICD-10-CM | POA: Diagnosis not present

## 2024-02-12 ENCOUNTER — Ambulatory Visit: Admitting: Obstetrics and Gynecology
